# Patient Record
Sex: Male | Born: 1940 | Race: White | Hispanic: No | Marital: Married | Smoking: Current every day smoker
Health system: Southern US, Community
[De-identification: ages and names within clinical notes are randomized; demographics above are authoritative.]

## PROBLEM LIST (undated history)

## (undated) DIAGNOSIS — R569 Unspecified convulsions: Secondary | ICD-10-CM

## (undated) DIAGNOSIS — C679 Malignant neoplasm of bladder, unspecified: Secondary | ICD-10-CM

## (undated) DIAGNOSIS — I639 Cerebral infarction, unspecified: Secondary | ICD-10-CM

## (undated) DIAGNOSIS — D649 Anemia, unspecified: Secondary | ICD-10-CM

## (undated) HISTORY — PX: VEIN SURGERY: SHX48

## (undated) HISTORY — DX: Unspecified convulsions: R56.9

## (undated) HISTORY — PX: VASCULAR SURGERY: SHX849

---

## 1998-04-11 ENCOUNTER — Ambulatory Visit: Admission: RE | Admit: 1998-04-11 | Discharge: 1998-04-11 | Payer: Self-pay | Admitting: *Deleted

## 1998-05-09 ENCOUNTER — Inpatient Hospital Stay (HOSPITAL_COMMUNITY): Admission: RE | Admit: 1998-05-09 | Discharge: 1998-05-11 | Payer: Self-pay | Admitting: *Deleted

## 1999-01-01 ENCOUNTER — Ambulatory Visit: Admission: RE | Admit: 1999-01-01 | Discharge: 1999-01-01 | Payer: Self-pay | Admitting: *Deleted

## 2006-08-18 ENCOUNTER — Ambulatory Visit: Payer: Self-pay | Admitting: Vascular Surgery

## 2006-09-01 ENCOUNTER — Ambulatory Visit: Payer: Self-pay | Admitting: Vascular Surgery

## 2006-10-05 ENCOUNTER — Ambulatory Visit: Payer: Self-pay | Admitting: Vascular Surgery

## 2006-10-13 ENCOUNTER — Ambulatory Visit: Payer: Self-pay | Admitting: Vascular Surgery

## 2007-01-19 ENCOUNTER — Ambulatory Visit: Payer: Self-pay | Admitting: Vascular Surgery

## 2010-10-22 NOTE — Assessment & Plan Note (Signed)
OFFICE VISIT   Pidgeon, Jaycub L  DOB:  29-Apr-1941                                       01/19/2007  WGNFA#:[id number]   The patient had laser ablation of his right greater saphenous vein on  4/28, multiple stab phlebectomies. 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 He has had an excellent result with no discomfort  at this time.   He continues to have some scaliness and hyperpigmentation which are  chronic in the lower third of the leg but no recurrent ulcers and no  edema distally. He has excellent palpable dorsalis pedis and posterior  tibial pulses and has no large varicosities which have recurred.   He was reassured regarding these findings and he will return to see [place]  on a p.r.n. basis.   Quita Skye Hart [place], M.D.  Electronically Signed   JDL/MEDQ  D:  01/19/2007  T:  01/21/2007  Job:  [id number]

## 2020-02-10 ENCOUNTER — Emergency Department (HOSPITAL_COMMUNITY): Payer: Medicare Other

## 2020-02-10 ENCOUNTER — Other Ambulatory Visit: Payer: Self-pay

## 2020-02-10 ENCOUNTER — Encounter (HOSPITAL_COMMUNITY): Payer: Self-pay | Admitting: Internal Medicine

## 2020-02-10 ENCOUNTER — Inpatient Hospital Stay (HOSPITAL_COMMUNITY)
Admission: EM | Admit: 2020-02-10 | Discharge: 2020-02-13 | DRG: 101 | Disposition: A | Discharge status: Home / Self Care | Payer: Medicare Other | Attending: Family Medicine | Admitting: Family Medicine

## 2020-02-10 DIAGNOSIS — F17200 Nicotine dependence, unspecified, uncomplicated: Secondary | ICD-10-CM | POA: Diagnosis present

## 2020-02-10 DIAGNOSIS — R000 Tachycardia, unspecified: Secondary | ICD-10-CM | POA: Diagnosis present

## 2020-02-10 DIAGNOSIS — I959 Hypotension, unspecified: Secondary | ICD-10-CM | POA: Diagnosis present

## 2020-02-10 DIAGNOSIS — R569 Unspecified convulsions: Principal | ICD-10-CM

## 2020-02-10 DIAGNOSIS — R4182 Altered mental status, unspecified: Secondary | ICD-10-CM | POA: Diagnosis present

## 2020-02-10 DIAGNOSIS — N179 Acute kidney failure, unspecified: Secondary | ICD-10-CM

## 2020-02-10 DIAGNOSIS — R6510 Systemic inflammatory response syndrome (SIRS) of non-infectious origin without acute organ dysfunction: Secondary | ICD-10-CM

## 2020-02-10 DIAGNOSIS — I739 Peripheral vascular disease, unspecified: Secondary | ICD-10-CM | POA: Diagnosis present

## 2020-02-10 DIAGNOSIS — E785 Hyperlipidemia, unspecified: Secondary | ICD-10-CM | POA: Diagnosis present

## 2020-02-10 DIAGNOSIS — Z20822 Contact with and (suspected) exposure to covid-19: Secondary | ICD-10-CM | POA: Diagnosis present

## 2020-02-10 DIAGNOSIS — N1831 Chronic kidney disease, stage 3a: Secondary | ICD-10-CM | POA: Diagnosis present

## 2020-02-10 DIAGNOSIS — Z789 Other specified health status: Secondary | ICD-10-CM

## 2020-02-10 DIAGNOSIS — F1020 Alcohol dependence, uncomplicated: Secondary | ICD-10-CM | POA: Diagnosis present

## 2020-02-10 DIAGNOSIS — N390 Urinary tract infection, site not specified: Secondary | ICD-10-CM | POA: Diagnosis present

## 2020-02-10 DIAGNOSIS — B961 Klebsiella pneumoniae [K. pneumoniae] as the cause of diseases classified elsewhere: Secondary | ICD-10-CM | POA: Diagnosis present

## 2020-02-10 LAB — CBC
HCT: 39.8 % (ref 39.0–52.0)
Hemoglobin: 13 g/dL (ref 13.0–17.0)
MCH: 32.5 pg (ref 26.0–34.0)
MCHC: 32.7 g/dL (ref 30.0–36.0)
MCV: 99.5 fL (ref 80.0–100.0)
Platelets: 256 10*3/uL (ref 150–400)
RBC: 4 MIL/uL — ABNORMAL LOW (ref 4.22–5.81)
RDW: 12.8 % (ref 11.5–15.5)
WBC: 8.9 10*3/uL (ref 4.0–10.5)
nRBC: 0 % (ref 0.0–0.2)

## 2020-02-10 LAB — DIFFERENTIAL
Abs Immature Granulocytes: 0.03 10*3/uL (ref 0.00–0.07)
Basophils Absolute: 0.1 10*3/uL (ref 0.0–0.1)
Basophils Relative: 1 %
Eosinophils Absolute: 0.1 10*3/uL (ref 0.0–0.5)
Eosinophils Relative: 2 %
Immature Granulocytes: 0 %
Lymphocytes Relative: 30 %
Lymphs Abs: 2.7 10*3/uL (ref 0.7–4.0)
Monocytes Absolute: 0.7 10*3/uL (ref 0.1–1.0)
Monocytes Relative: 8 %
Neutro Abs: 5.2 10*3/uL (ref 1.7–7.7)
Neutrophils Relative %: 59 %

## 2020-02-10 LAB — I-STAT CHEM 8, ED
BUN: 13 mg/dL (ref 8–23)
Calcium, Ion: 1.11 mmol/L — ABNORMAL LOW (ref 1.15–1.40)
Chloride: 103 mmol/L (ref 98–111)
Creatinine, Ser: 1.4 mg/dL — ABNORMAL HIGH (ref 0.61–1.24)
Glucose, Bld: 115 mg/dL — ABNORMAL HIGH (ref 70–99)
HCT: 37 % — ABNORMAL LOW (ref 39.0–52.0)
Hemoglobin: 12.6 g/dL — ABNORMAL LOW (ref 13.0–17.0)
Potassium: 3.7 mmol/L (ref 3.5–5.1)
Sodium: 136 mmol/L (ref 135–145)
TCO2: 16 mmol/L — ABNORMAL LOW (ref 22–32)

## 2020-02-10 LAB — COMPREHENSIVE METABOLIC PANEL
ALT: 25 U/L (ref 0–44)
AST: 32 U/L (ref 15–41)
Albumin: 3.2 g/dL — ABNORMAL LOW (ref 3.5–5.0)
Alkaline Phosphatase: 84 U/L (ref 38–126)
Anion gap: 19 — ABNORMAL HIGH (ref 5–15)
BUN: 13 mg/dL (ref 8–23)
CO2: 15 mmol/L — ABNORMAL LOW (ref 22–32)
Calcium: 8.9 mg/dL (ref 8.9–10.3)
Chloride: 101 mmol/L (ref 98–111)
Creatinine, Ser: 1.65 mg/dL — ABNORMAL HIGH (ref 0.61–1.24)
GFR calc Af Amer: 45 mL/min — ABNORMAL LOW (ref >60)
GFR calc non Af Amer: 39 mL/min — ABNORMAL LOW (ref >60)
Glucose, Bld: 122 mg/dL — ABNORMAL HIGH (ref 70–99)
Potassium: 3.5 mmol/L (ref 3.5–5.1)
Sodium: 135 mmol/L (ref 135–145)
Total Bilirubin: 0.7 mg/dL (ref 0.3–1.2)
Total Protein: 7.3 g/dL (ref 6.5–8.1)

## 2020-02-10 LAB — PROTIME-INR
INR: 1.1 (ref 0.8–1.2)
Prothrombin Time: 13.4 seconds (ref 11.4–15.2)

## 2020-02-10 LAB — CBG MONITORING, ED
Glucose-Capillary: 121 mg/dL — ABNORMAL HIGH (ref 70–99)
Glucose-Capillary: 137 mg/dL — ABNORMAL HIGH (ref 70–99)

## 2020-02-10 LAB — APTT: aPTT: 31 seconds (ref 24–36)

## 2020-02-10 LAB — ETHANOL: Alcohol, Ethyl (B): <10 mg/dL (ref <10)

## 2020-02-10 MED ORDER — ONDANSETRON HCL 4 MG/2ML IJ SOLN: 4.0000 mg | Freq: Four times a day (QID) | INTRAMUSCULAR | Status: DC | PRN

## 2020-02-10 MED ORDER — ONDANSETRON HCL 4 MG PO TABS: 4.0000 mg | ORAL_TABLET | Freq: Four times a day (QID) | ORAL | Status: DC | PRN

## 2020-02-10 MED ORDER — SODIUM CHLORIDE 0.9 % IV SOLN: INTRAVENOUS | Status: AC

## 2020-02-10 MED ORDER — LEVETIRACETAM IN NACL 1000 MG/100ML IV SOLN
1000.0000 mg | Freq: Once | INTRAVENOUS | Status: AC
  Administered 2020-02-10: 1000 mg via INTRAVENOUS

## 2020-02-10 MED ORDER — SODIUM CHLORIDE 0.9 % IV BOLUS
1000.0000 mL | Freq: Once | INTRAVENOUS | Status: AC
  Administered 2020-02-10: 1000 mL via INTRAVENOUS

## 2020-02-10 MED ORDER — THIAMINE HCL 100 MG/ML IJ SOLN
100.0000 mg | Freq: Every day | INTRAMUSCULAR | Status: DC
  Administered 2020-02-11 (×2): 100 mg via INTRAVENOUS
  Filled 2020-02-10 (×2): qty 2

## 2020-02-10 MED ORDER — ENOXAPARIN SODIUM 40 MG/0.4ML ~~LOC~~ SOLN
40.0000 mg | Freq: Every day | SUBCUTANEOUS | Status: DC
  Administered 2020-02-11 – 2020-02-12 (×3): 40 mg via SUBCUTANEOUS
  Filled 2020-02-10 (×3): qty 0.4

## 2020-02-10 MED ORDER — LEVETIRACETAM IN NACL 1000 MG/100ML IV SOLN
1000.0000 mg | Freq: Once | INTRAVENOUS | Status: DC
  Filled 2020-02-10: qty 100

## 2020-02-10 NOTE — ED Provider Notes (Signed)
[facility] EMERGENCY DEPARTMENT Provider Note   CSN: [id number] Arrival date & time: 02/10/20  2036  An emergency department physician performed an initial assessment on this suspected stroke patient at 2039.  History No chief complaint on file.   Robert Blackwell is a 79 y.o. male brought in by EMS for evaluation of altered mental status.  Wife reports that about 1520 minutes prior to ED arrival, they were at home and were sitting in the recliner is watching TV.  She reports that his chair leaned all the way back.  She looked over and saw that he had started shaking both his arms and his legs and foaming at the mouth.  She states that he went unresponsive but was still breathing.  She states that this shaking lasted for about 2 minutes.  She reports that after the shaking stopped, he was still altered and was not responding though was breathing the entire time.  He called EMS.  On EMS arrival, patient was becoming more alert but was combative. Patient states he does not member happened.  He states that last he remembers was watching TV and then waking up in the ambulance.  At this time, he has no complaints.  He states he has been in his normal state of health recently and no signs of sickness, fevers.  He denies any vision changes, chest pain, difficulty breathing, abdominal pain, nausea/vomiting, numbness/weakness of his arms or legs.  He states he smokes.  He also drinks alcohol.  He estimates that he may drink 2-3 beers a day.  His last drink was 3 PM.  He has never gone into withdrawals from alcohol.  The history is provided by the patient.       No past medical history on file.  Patient Active Problem List   Diagnosis Date Noted  . Seizure ([place]) 02/10/2020  . ARF (acute renal failure) ([place]) 02/10/2020     The histories are not reviewed yet. Please review them in the "History" navigator section and refresh this [place].     No family history on file.  Social  History   Tobacco Use  . Smoking status: Not on file  Substance Use Topics  . Alcohol use: Not on file  . Drug use: Not on file    Home Medications Prior to Admission medications   Not on File    Allergies    Patient has no allergy information on record.  Review of Systems   Review of Systems  Constitutional: Negative for fever.  Respiratory: Negative for cough and shortness of breath.   Cardiovascular: Negative for chest pain.  Gastrointestinal: Negative for abdominal pain, nausea and vomiting.  Genitourinary: Negative for dysuria and hematuria.  Neurological: Positive for seizures. Negative for weakness, numbness and headaches.  All other systems reviewed and are negative.   Physical Exam Updated Vital Signs BP 100/65   Pulse 97   Temp 98.1 F (36.7 C) (Oral)   Resp 17   Ht 5\' 10"  (1.778 m)   Wt 77.1 kg   SpO2 95%   BMI 24.39 kg/m   Physical Exam Vitals and nursing note reviewed.  Constitutional:      Appearance: Normal appearance. He is well-developed.  HENT:     Head: Normocephalic and atraumatic.  Eyes:     General: Lids are normal.     Conjunctiva/sclera: Conjunctivae normal.     Pupils: Pupils are equal, round, and reactive to light.     Comments: PERRL.  EOMs intact. No nystagmus. No neglect.   Cardiovascular:     Rate and Rhythm: Normal rate and regular rhythm.     Pulses: Normal pulses.     Heart sounds: Normal heart sounds. No murmur heard.  No friction rub. No gallop.   Pulmonary:     Effort: Pulmonary effort is normal.     Breath sounds: Normal breath sounds.     Comments: Lungs clear to auscultation bilaterally.  Symmetric chest rise.  No wheezing, rales, rhonchi. Abdominal:     Palpations: Abdomen is soft. Abdomen is not rigid.     Tenderness: There is no abdominal tenderness. There is no guarding.     Comments: Abdomen is soft, non-distended, non-tender. No rigidity, No guarding. No peritoneal signs.  Musculoskeletal:        General:  Normal range of motion.     Cervical back: Full passive range of motion without pain.  Skin:    General: Skin is warm and dry.     Capillary Refill: Capillary refill takes less than 2 seconds.  Neurological:     Mental Status: He is alert and oriented to person, place, and time.     Comments: Cranial nerves III-XII intact Follows commands, Moves all extremities  5/5 strength to BUE and BLE  Sensation intact throughout all major nerve distributions No pronator drift. No slurred speech. No facial droop.   Psychiatric:        Speech: Speech normal.     ED Results / Procedures / Treatments   Labs (all labs ordered are listed, but only abnormal results are displayed) Labs Reviewed  CBC - Abnormal; Notable for the following components:      Result Value   RBC 4.00 (*)    All other components within normal limits  COMPREHENSIVE METABOLIC PANEL - Abnormal; Notable for the following components:   CO2 15 (*)    Glucose, Bld 122 (*)    Creatinine, Ser 1.65 (*)    Albumin 3.2 (*)    GFR calc non Af Amer 39 (*)    GFR calc Af Amer 45 (*)    Anion gap 19 (*)    All other components within normal limits  I-STAT CHEM 8, ED - Abnormal; Notable for the following components:   Creatinine, Ser 1.40 (*)    Glucose, Bld 115 (*)    Calcium, Ion 1.11 (*)    TCO2 16 (*)    Hemoglobin 12.6 (*)    HCT 37.0 (*)    All other components within normal limits  CBG MONITORING, ED - Abnormal; Notable for the following components:   Glucose-Capillary 121 (*)    All other components within normal limits  CBG MONITORING, ED - Abnormal; Notable for the following components:   Glucose-Capillary 137 (*)    All other components within normal limits  SARS CORONAVIRUS 2 BY RT PCR (HOSPITAL ORDER, [facility])  ETHANOL  PROTIME-INR  APTT  DIFFERENTIAL  RAPID URINE DRUG SCREEN, HOSP PERFORMED  URINALYSIS, ROUTINE W REFLEX MICROSCOPIC    EKG EKG  Interpretation  Date/Time:  Friday February 10 2020 21:00:05 EDT Ventricular Rate:  105 PR Interval:    QRS Duration: 96 QT Interval:  356 QTC Calculation: 471 R Axis:   35 Text Interpretation: Sinus tachycardia Borderline T abnormalities, lateral leads No STEMI Confirmed by Octaviano Glow [phone] on 02/10/2020 10:14:20 PM   Radiology CT HEAD CODE STROKE WO CONTRAST  Result Date: 02/10/2020 CLINICAL DATA:  Code  stroke. Neuro deficit, acute, stroke suspected. EXAM: CT HEAD WITHOUT CONTRAST TECHNIQUE: Contiguous axial images were obtained from the base of the skull through the vertex without intravenous contrast. COMPARISON:  None. FINDINGS: Brain: No acute cortically based infarct, intracranial hemorrhage, mass, midline shift, or extra-axial fluid collection is identified. Mild cerebral atrophy is within normal limits for age. Patchy hypodensities in the cerebral white matter bilaterally are nonspecific but compatible with mild-to-moderate chronic small vessel ischemic disease. There are one or two suspected chronic lacunar infarcts along the left caudate body and periventricular white matter. A subcentimeter left cerebellar infarct also is chronic in appearance. Vascular: Calcified atherosclerosis at the skull base. No hyperdense vessel. Skull: No fracture or suspicious osseous lesion. Sinuses/Orbits: Minimal mucosal thickening in the ethmoid sinuses. Clear mastoid air cells. Unremarkable orbits. Other: None. ASPECTS [facility] Stroke Program Early CT Score) - Ganglionic level infarction (caudate, lentiform nuclei, internal capsule, insula, M1-M3 cortex): 7 - Supraganglionic infarction (M4-M6 cortex): 3 Total score (0-10 with 10 being normal): 10 IMPRESSION: 1. No evidence of acute intracranial abnormality. 2. ASPECTS is 10. 3. Mild-to-moderate chronic small vessel ischemic disease. These results were communicated to Dr. Leonel Ramsay at 8:59 pm on 02/10/2020 by text page via the [facility] messaging system.  Electronically Signed   By: Logan Bores M.D.   On: 02/10/2020 21:00    Procedures Procedures (including critical care time)  Medications Ordered in ED Medications  sodium chloride 0.9 % bolus 1,000 mL (1,000 mLs Intravenous Bolus from Bag 02/10/20 2226)  levETIRAcetam (KEPPRA) IVPB 1000 mg/100 mL premix (1,000 mg Intravenous New Bag/Given 02/10/20 2316)    ED Course  I have reviewed the triage vital signs and the nursing notes.  Pertinent labs & imaging results that were available during my care of the patient were reviewed by me and considered in my medical decision making (see chart for details).  Clinical Course as of Feb 09 2350  Fri Feb 10, 2020  2242 Pt encouraged to provide urine sample, says she will do so.   [MT]  2242 79 yo male presenting to ED with suspected new onset seizure.  Wife provides supplemental history.  She says patient was in recliner this evening watching TV when his entire body stiffned, his legs sticking straight out, his hands clamped over his chest, and he had generalized shaking and heavy breathing for a few minutes.  She called EMS.  He remained confused with heavy breathing by the time they arrived.  He has since returned to baseline mental state.  The patient has no acute complaints, denies headaches or neuro symptoms.  He has no hx of seizures.  No infectious symptoms or signs.  He reports drinking beer daily, last drink at 3 pm, but denies hx of withdrawal or seizures, and goes days without drinking.  On exam his vitals are WNL, afebrile, and he has a benign neuro exam.  Na and K normal.  Cr elevated at 1.65 - patient made aware (he does not see a doctor regularly, I recommended establishing care and regular appointments).  Glucose wnl.  CTH without acute abnormality.  Pending UA to complete infectious w/u.  Will discuss with neurology regarding treatment for new onset seizure.   [MT]    Clinical Course User Index [MT] Trifan, Carola Rhine, MD   MDM  Rules/Calculators/A&P                          79 year old male who presents for evaluation of  possible seizure activity.  Wife noted that at home, he had leaned back and started shaking both his arms and legs.  This lasted for about 2 minutes.  During which time, he was unresponsive.  No vomiting, urinary incontinence.  She reports that after the shaking stopped, he was still altered.  On EMS arrival, he was becoming more alert but was combative.  Wife states that he has not been sick recently.  Denies any fevers, chills.  On initially arrival, he is afebrile, nontoxic-appearing.  Vital signs are stable.  He is alert and oriented x 3 and able to answer my questions.  Wife does report that he is now back to his mental baseline.  He was initially brought in as a code stroke but was canceled given resolution of symptoms.  On exam, he has no neuro deficits noted.  Concern for possible first-time seizure.  Neuro consulted.  CMP shows sodium 135, potassium 3.5.  BUN is 13, cardio 1.65.  CBC shows no leukocytosis or anemia.  CT head negative for any acute cranial abnormality.  Patient had a another seizure witnessed by wife and ED attending.  Afterwards, he was postictal.  Patient started on loading dose of Keppra.  Will be admitted for EEG. Dr. Leonel Ramsay (Neuro) aware. Will consult as needed.   Discussed patient with Dr. Hal Hope (hospitalist) who accepts patient for admission.   Reevaluation. Patient still postictal but is alert and protecting his airway. Keppra given.   Portions of this note were generated with [employer]. Dictation errors may occur despite best attempts at proofreading.  Final Clinical Impression(s) / ED Diagnoses Final diagnoses:  AKI (acute kidney injury) ([place])  Seizure ([place])    Rx / DC Orders ED Discharge Orders         Ordered    Ambulatory referral to Neurology       Comments: An appointment is requested in approximately: 1 week   02/10/20 2214            Volanda Napoleon, PA-C 02/10/20 2352    Wyvonnia Dusky, MD 02/11/20 [phone]

## 2020-02-10 NOTE — Code Documentation (Signed)
Stroke Response Nurse Documentation Code Documentation  Robert Blackwell is a 79 y.o. male arriving to [place]. [facility] ED via [facility] on 9/3 with no past medical hx on file. Pt is a current smoker and drinks a couple of beers daily. Code stroke was activated by [facility]. Patient from home where he was LKW at 2010 and now complaining of AMS. His wife witnessed the patient become stiff and foam at the mouth while watching TV. On No antithrombotic. Stroke team at the bedside on patient arrival. Labs drawn and patient cleared for CT by Dr. Sedonia Small. Patient to CT with team. NIHSS 2, see documentation for details and code stroke times. Patient with disoriented on exam. The following imaging was completed:  CT. Code stroke was cancelled per Dr. Leonel Ramsay. Bedside handoff with ED RN Beryle Flock.    Madelynn Done  Rapid Response RN

## 2020-02-10 NOTE — Consult Note (Signed)
Neurology Consultation Reason for Consult: New onset seizures Referring Physician: Hal Hope, a  CC: Seizures  History is obtained from: Wife, EMS  HPI: Robert Blackwell is a 79 y.o. male with no significant past medical history who presents with new onset seizures.  He was watching TV with his wife when all of a sudden he kicked his legs out and his arms flexed and and he shook for a couple of minutes.  Following this, he was unresponsive and his wife dialed 911.  Due to altered mental status, EMS activated a code stroke, and the patient became combative in the ambulance.  Gradually improved to the point where he was talkative on arrival.  After being evaluated with head CT and labs, he subsequently had a second seizure.  I was able to evaluate him shortly after the seizure at which point he was postictal, but localizing bilaterally.  He drinks a couple beers a day, but no recent changes, had a beer earlier today.   ROS: A 14 point ROS was performed and is negative except as noted in the HPI.   History reviewed. No pertinent past medical history.   Family History  Problem Relation Age of Onset  . Seizures Neg Hx      Social History:  reports that he has been smoking. He has never used smokeless tobacco. He reports current alcohol use. No history on file for drug use.   Exam: Current vital signs: BP 100/65   Pulse 97   Temp 98.1 F (36.7 C) (Oral)   Resp 17   Ht 5\' 10"  (1.778 m)   Wt 77.1 kg   SpO2 95%   BMI 24.39 kg/m  Vital signs in last 24 hours: Temp:  [98.1 F (36.7 C)] 98.1 F (36.7 C) (09/03 2101) Pulse Rate:  [97-102] 97 (09/03 2200) Resp:  [17-20] 17 (09/03 2200) BP: (100-111)/(65-75) 100/65 (09/03 2200) SpO2:  [92 %-97 %] 95 % (09/03 2204) Weight:  [75 kg-77.1 kg] 77.1 kg (09/03 2101)   Physical Exam  Constitutional: Appears well-developed and well-nourished.  Psych: Affect appropriate to situation Eyes: No scleral injection HENT: No OP obstrucion MSK:  no joint deformities.  Cardiovascular: Normal rate and regular rhythm.  Respiratory: Effort normal, non-labored breathing GI: Soft.  No distension. There is no tenderness.  Skin: WDI  Neuro: Mental Status: Patient is awake, alert, oriented to person, place, not able to give the month or year patient is able to give a clear and coherent history. No signs of aphasia or neglect Cranial Nerves: II: Visual Fields are full. Pupils are equal, round, and reactive to light.   III,IV, VI: EOMI without ptosis or diploplia.  V: Facial sensation is symmetric to temperature VII: Facial movement is symmetric.  VIII: hearing is intact to voice X: Uvula elevates symmetrically XI: Shoulder shrug is symmetric. XII: tongue is midline without atrophy or fasciculations.  Motor: Tone is normal. Bulk is normal. 5/5 strength was present in all four extremities.  Sensory: Sensation is symmetric to light touch and temperature in the arms and legs. Cerebellar: FNF and HKS are intact bilaterally    I have reviewed labs in epic and the results pertinent to this consultation are: Creatinine 1.6 WBC 8.9  I have reviewed the images obtained: CT head-negative  Impression: 79 year old male with new onset seizures, now with two separate seizures.  I would favor antiepileptic therapy at this time.  He will need further evaluation with MRI/EEG.  No current signs or symptoms of infection.  Recommendations: 1) MRI brain 2) EEG 3) Keppra 500 mg twice daily   Roland Rack, MD [facility] [phone]  If 7pm- 7am, please page neurology on call as listed in [place].

## 2020-02-10 NOTE — Discharge Instructions (Addendum)
As we discussed today, we are concerned you may have had a seizure.  You should not drive a vehicle, bathe, shower or swimming pool by herself until cleared by neurology.  I referred you to [facility] neurology.  They should be contacting you regarding an appointment.  If you have not heard from them in the next several days, call their office.  Your kidney function was slightly elevated in the ER today. This needs to be rechecked by your primary care doctor in 1 week.   Return the emergency department any repeat seizure activity, vomiting or any other worsening or concerning symptoms.

## 2020-02-10 NOTE — H&P (Addendum)
History and Physical    Robert Blackwell TIR:[id number] DOB: Oct 07, 1940 DOA: 02/10/2020  PCP: No primary care provider on file.  Patient coming from: Home.  Chief Complaint: Seizures.  HPI: Robert Blackwell is a 79 y.o. male with history of peripheral vascular disease status post left lower extremity bypass, hyperlipidemia, tobacco and alcohol abuse drinks 4 beers every day was watching TV with his wife this evening when patient suddenly had a tonic-clonic seizure witnessed by patient's wife. Lasted for about 1 or 2 minutes and patient lost consciousness. EMS was called and patient was brought to the ER. Patient did not have any headache or any focal deficits. Has not had any seizures previously.  ED Course: In the ER patient was back to normal CT head is unremarkable. EKG shows sinus tachycardia. Plan is to discharge patient home and patient again had a tonic-clonic seizure lasted for less than a minute and resolved without any intervention. At this point neurologist advised patient to be loaded with Keppra and admitted for further seizure work-up. At the time of my exam patient is back to alert awake and oriented labs showing creatinine 1.6 no old labs to compare.  Review of Systems: As per HPI, rest all negative.   History reviewed. No pertinent past medical history.  Past Surgical History:  Procedure Laterality Date  . VASCULAR SURGERY    . VEIN SURGERY       reports that he has been smoking. He has never used smokeless tobacco. He reports current alcohol use. No history on file for drug use.  Not on File  Family History  Problem Relation Age of Onset  . Seizures Neg Hx     Prior to Admission medications   Not on File    Physical Exam: Constitutional: Moderately built and nourished. Vitals:   02/10/20 2101 02/10/20 2145 02/10/20 2200 02/10/20 2204  BP: 111/75 103/67 100/65   Pulse: (!) 102 100 97   Resp: 20 19 17    Temp: 98.1 F (36.7 C)     TempSrc: Oral     SpO2: 97% 94%  92% 95%  Weight: 77.1 kg     Height: 5\' 10"  (1.778 m)      Eyes: Anicteric no pallor. ENMT: No discharge from the ears eyes nose or mouth. Neck: No mass felt. No neck rigidity. Respiratory: No rhonchi or crepitations. Cardiovascular: S1-S2 heard. Abdomen: Soft nontender bowel sounds present. Musculoskeletal: No edema. Skin: No rash. Neurologic: Alert awake oriented to time place and person. Moves all extremities 5 x 5. Psychiatric: Appears normal. Normal affect.   Labs on Admission: I have personally reviewed following labs and imaging studies  CBC: Recent Labs  Lab 02/10/20 2043 02/10/20 2044  WBC 8.9  --   NEUTROABS 5.2  --   HGB 13.0 12.6*  HCT 39.8 37.0*  MCV 99.5  --   PLT 256  --    Basic Metabolic Panel: Recent Labs  Lab 02/10/20 2043 02/10/20 2044  NA 135 136  K 3.5 3.7  CL 101 103  CO2 15*  --   GLUCOSE 122* 115*  BUN 13 13  CREATININE 1.65* 1.40*  CALCIUM 8.9  --    GFR: Estimated Creatinine Clearance: 44.2 mL/min (A) (by C-G formula based on SCr of 1.4 mg/dL (H)). Liver Function Tests: Recent Labs  Lab 02/10/20 2043  AST 32  ALT 25  ALKPHOS 84  BILITOT 0.7  PROT 7.3  ALBUMIN 3.2*   No results for input(s): LIPASE,  AMYLASE in the last 168 hours. No results for input(s): AMMONIA in the last 168 hours. Coagulation Profile: Recent Labs  Lab 02/10/20 2043  INR 1.1   Cardiac Enzymes: No results for input(s): CKTOTAL, CKMB, CKMBINDEX, TROPONINI in the last 168 hours. BNP (last 3 results) No results for input(s): PROBNP in the last 8760 hours. HbA1C: No results for input(s): HGBA1C in the last 72 hours. CBG: Recent Labs  Lab 02/10/20 2038 02/10/20 2258  GLUCAP 121* 137*   Lipid Profile: No results for input(s): CHOL, HDL, LDLCALC, TRIG, CHOLHDL, LDLDIRECT in the last 72 hours. Thyroid Function Tests: No results for input(s): TSH, T4TOTAL, FREET4, T3FREE, THYROIDAB in the last 72 hours. Anemia Panel: No results for input(s):  VITAMINB12, FOLATE, FERRITIN, TIBC, IRON, RETICCTPCT in the last 72 hours. Urine analysis: No results found for: COLORURINE, APPEARANCEUR, LABSPEC, PHURINE, GLUCOSEU, HGBUR, BILIRUBINUR, KETONESUR, PROTEINUR, UROBILINOGEN, NITRITE, LEUKOCYTESUR Sepsis Labs: @LABRCNTIP (procalcitonin:4,lacticidven:4) )No results found for this or any previous visit (from the past 240 hour(s)).   Radiological Exams on Admission: CT HEAD CODE STROKE WO CONTRAST  Result Date: 02/10/2020 CLINICAL DATA:  Code stroke. Neuro deficit, acute, stroke suspected. EXAM: CT HEAD WITHOUT CONTRAST TECHNIQUE: Contiguous axial images were obtained from the base of the skull through the vertex without intravenous contrast. COMPARISON:  None. FINDINGS: Brain: No acute cortically based infarct, intracranial hemorrhage, mass, midline shift, or extra-axial fluid collection is identified. Mild cerebral atrophy is within normal limits for age. Patchy hypodensities in the cerebral white matter bilaterally are nonspecific but compatible with mild-to-moderate chronic small vessel ischemic disease. There are one or two suspected chronic lacunar infarcts along the left caudate body and periventricular white matter. A subcentimeter left cerebellar infarct also is chronic in appearance. Vascular: Calcified atherosclerosis at the skull base. No hyperdense vessel. Skull: No fracture or suspicious osseous lesion. Sinuses/Orbits: Minimal mucosal thickening in the ethmoid sinuses. Clear mastoid air cells. Unremarkable orbits. Other: None. ASPECTS [facility] Stroke Program Early CT Score) - Ganglionic level infarction (caudate, lentiform nuclei, internal capsule, insula, M1-M3 cortex): 7 - Supraganglionic infarction (M4-M6 cortex): 3 Total score (0-10 with 10 being normal): 10 IMPRESSION: 1. No evidence of acute intracranial abnormality. 2. ASPECTS is 10. 3. Mild-to-moderate chronic small vessel ischemic disease. These results were communicated to Dr. Leonel Ramsay  at 8:59 pm on 02/10/2020 by text page via the [facility] messaging system. Electronically Signed   By: Logan Bores M.D.   On: 02/10/2020 21:00    EKG: Independently reviewed. Sinus tachycardia.  Assessment/Plan Principal Problem:   Seizure ([place]) Active Problems:   AKI (acute kidney injury) ([place])    1. Seizures new onset -discussed with on-call neurologist Dr. Leonel Ramsay who advised to continue Keppra 500 twice daily. Check MRI brain and EEG. Seizure precautions. 2. Alcohol abuse advised about quitting. We will keep patient on thiamine we will start CIWA protocol. 3. Acute renal failure no old labs to compare. We will gently hydrate. Check UA. Follow metabolic panel. 4. History of peripheral artery disease status post left-sided bypass. No acute issues at this time takes aspirin at home. 5. History of hyperlipidemia per chart in Care Everywhere. Not on any statins at this time. 6. Tobacco abuse advised about quitting.  Addendum -at around 4:20 AM February 11, 2020 patient became hypotensive with blood pressure systolic in the 66Y. Otherwise asymptomatic. Afebrile. Not hypoxic. Will give a fluid bolus check repeat labs including lactic acid procalcitonin blood cultures.   DVT prophylaxis: Lovenox. Code Status: Full code. Family Communication: Patient's wife. Disposition  Plan: Home. Consults called: Neurology. Admission status: Observation.   Rise Patience MD [facility] Pager [phone].  If 7PM-7AM, please contact night-coverage [email or web address] Password [facility]  02/10/2020, 11:57 PM

## 2020-02-10 NOTE — ED Notes (Signed)
Pt's wife stepped out of the room yelling "Help, help!" and stated her husband was having a seizure. Myself and an RN went into the room to ensure the pt was breathing adequately. The pt was then placed on 2lpm via NRB.

## 2020-02-11 ENCOUNTER — Observation Stay (HOSPITAL_COMMUNITY): Payer: Medicare Other

## 2020-02-11 ENCOUNTER — Other Ambulatory Visit (HOSPITAL_COMMUNITY): Payer: Medicare Other

## 2020-02-11 DIAGNOSIS — N179 Acute kidney failure, unspecified: Secondary | ICD-10-CM | POA: Diagnosis not present

## 2020-02-11 DIAGNOSIS — R569 Unspecified convulsions: Secondary | ICD-10-CM | POA: Diagnosis not present

## 2020-02-11 LAB — RAPID URINE DRUG SCREEN, HOSP PERFORMED
Amphetamines: NOT DETECTED
Barbiturates: NOT DETECTED
Benzodiazepines: NOT DETECTED
Cocaine: NOT DETECTED
Opiates: NOT DETECTED
Tetrahydrocannabinol: NOT DETECTED

## 2020-02-11 LAB — URINALYSIS, ROUTINE W REFLEX MICROSCOPIC
Bilirubin Urine: NEGATIVE
Glucose, UA: NEGATIVE mg/dL
Ketones, ur: NEGATIVE mg/dL
Nitrite: POSITIVE — AB
Protein, ur: NEGATIVE mg/dL
Specific Gravity, Urine: 1.014 (ref 1.005–1.030)
WBC, UA: >50 WBC/hpf — ABNORMAL HIGH (ref 0–5)
pH: 5 (ref 5.0–8.0)

## 2020-02-11 LAB — BLOOD CULTURE ID PANEL (REFLEXED) - BCID2

## 2020-02-11 LAB — CBC
HCT: 34.6 % — ABNORMAL LOW (ref 39.0–52.0)
HCT: 33.7 % — ABNORMAL LOW (ref 39.0–52.0)
Hemoglobin: 11.4 g/dL — ABNORMAL LOW (ref 13.0–17.0)
Hemoglobin: 10.9 g/dL — ABNORMAL LOW (ref 13.0–17.0)
MCH: 32.2 pg (ref 26.0–34.0)
MCH: 31.6 pg (ref 26.0–34.0)
MCHC: 32.9 g/dL (ref 30.0–36.0)
MCHC: 32.3 g/dL (ref 30.0–36.0)
MCV: 97.7 fL (ref 80.0–100.0)
MCV: 97.7 fL (ref 80.0–100.0)
Platelets: 168 10*3/uL (ref 150–400)
Platelets: 192 10*3/uL (ref 150–400)
RBC: 3.54 MIL/uL — ABNORMAL LOW (ref 4.22–5.81)
RBC: 3.45 MIL/uL — ABNORMAL LOW (ref 4.22–5.81)
RDW: 12.8 % (ref 11.5–15.5)
RDW: 12.9 % (ref 11.5–15.5)
WBC: 7.4 10*3/uL (ref 4.0–10.5)
WBC: 6.9 10*3/uL (ref 4.0–10.5)
nRBC: 0 % (ref 0.0–0.2)
nRBC: 0 % (ref 0.0–0.2)

## 2020-02-11 LAB — CORTISOL: Cortisol, Plasma: 8 ug/dL

## 2020-02-11 LAB — HEPATIC FUNCTION PANEL
ALT: 23 U/L (ref 0–44)
AST: 24 U/L (ref 15–41)
Albumin: 2.7 g/dL — ABNORMAL LOW (ref 3.5–5.0)
Alkaline Phosphatase: 68 U/L (ref 38–126)
Bilirubin, Direct: <0.1 mg/dL (ref 0.0–0.2)
Total Bilirubin: 0.7 mg/dL (ref 0.3–1.2)
Total Protein: 5.9 g/dL — ABNORMAL LOW (ref 6.5–8.1)

## 2020-02-11 LAB — BASIC METABOLIC PANEL
Anion gap: 10 (ref 5–15)
BUN: 11 mg/dL (ref 8–23)
CO2: 20 mmol/L — ABNORMAL LOW (ref 22–32)
Calcium: 8.5 mg/dL — ABNORMAL LOW (ref 8.9–10.3)
Chloride: 107 mmol/L (ref 98–111)
Creatinine, Ser: 1.44 mg/dL — ABNORMAL HIGH (ref 0.61–1.24)
GFR calc Af Amer: 53 mL/min — ABNORMAL LOW (ref >60)
GFR calc non Af Amer: 46 mL/min — ABNORMAL LOW (ref >60)
Glucose, Bld: 132 mg/dL — ABNORMAL HIGH (ref 70–99)
Potassium: 3.9 mmol/L (ref 3.5–5.1)
Sodium: 137 mmol/L (ref 135–145)

## 2020-02-11 LAB — LACTIC ACID, PLASMA
Lactic Acid, Venous: 1.2 mmol/L (ref 0.5–1.9)
Lactic Acid, Venous: 1.3 mmol/L (ref 0.5–1.9)

## 2020-02-11 LAB — TROPONIN I (HIGH SENSITIVITY): Troponin I (High Sensitivity): 31 ng/L — ABNORMAL HIGH (ref <18)

## 2020-02-11 LAB — PROCALCITONIN: Procalcitonin: <0.1 ng/mL

## 2020-02-11 LAB — CREATININE, SERUM
Creatinine, Ser: 1.4 mg/dL — ABNORMAL HIGH (ref 0.61–1.24)
GFR calc Af Amer: 55 mL/min — ABNORMAL LOW (ref >60)
GFR calc non Af Amer: 47 mL/min — ABNORMAL LOW (ref >60)

## 2020-02-11 LAB — SARS CORONAVIRUS 2 BY RT PCR (HOSPITAL ORDER, PERFORMED IN ~~LOC~~ HOSPITAL LAB): SARS Coronavirus 2: NEGATIVE

## 2020-02-11 MED ORDER — SODIUM CHLORIDE 0.9 % IV SOLN
1.0000 g | INTRAVENOUS | Status: DC
  Administered 2020-02-11 – 2020-02-13 (×3): 1 g via INTRAVENOUS
  Filled 2020-02-11 (×2): qty 1
  Filled 2020-02-11: qty 10

## 2020-02-11 MED ORDER — ADULT MULTIVITAMIN W/MINERALS CH
1.0000 | ORAL_TABLET | Freq: Every day | ORAL | Status: DC
  Administered 2020-02-11 – 2020-02-13 (×3): 1 via ORAL
  Filled 2020-02-11 (×3): qty 1

## 2020-02-11 MED ORDER — THIAMINE HCL 100 MG/ML IJ SOLN: 100.0000 mg | Freq: Every day | INTRAMUSCULAR | Status: DC

## 2020-02-11 MED ORDER — SODIUM CHLORIDE 0.9 % IV BOLUS
1000.0000 mL | Freq: Once | INTRAVENOUS | Status: AC
  Administered 2020-02-11: 1000 mL via INTRAVENOUS

## 2020-02-11 MED ORDER — SODIUM CHLORIDE 0.9 % IV BOLUS
500.0000 mL | Freq: Once | INTRAVENOUS | Status: AC
  Administered 2020-02-11: 500 mL via INTRAVENOUS

## 2020-02-11 MED ORDER — THIAMINE HCL 100 MG PO TABS
100.0000 mg | ORAL_TABLET | Freq: Every day | ORAL | Status: DC
  Administered 2020-02-11 – 2020-02-13 (×3): 100 mg via ORAL
  Filled 2020-02-11 (×3): qty 1

## 2020-02-11 MED ORDER — FOLIC ACID 1 MG PO TABS
1.0000 mg | ORAL_TABLET | Freq: Every day | ORAL | Status: DC
  Administered 2020-02-11 – 2020-02-13 (×3): 1 mg via ORAL
  Filled 2020-02-11 (×3): qty 1

## 2020-02-11 MED ORDER — LEVETIRACETAM 500 MG PO TABS
500.0000 mg | ORAL_TABLET | Freq: Two times a day (BID) | ORAL | Status: DC
  Administered 2020-02-11 – 2020-02-13 (×5): 500 mg via ORAL
  Filled 2020-02-11 (×5): qty 1

## 2020-02-11 MED ORDER — LORAZEPAM 2 MG/ML IJ SOLN: 1.0000 mg | INTRAMUSCULAR | Status: DC | PRN

## 2020-02-11 MED ORDER — LORAZEPAM 1 MG PO TABS: 1.0000 mg | ORAL_TABLET | ORAL | Status: DC | PRN

## 2020-02-11 NOTE — Progress Notes (Signed)
PHARMACY - PHYSICIAN COMMUNICATION CRITICAL VALUE ALERT - BLOOD CULTURE IDENTIFICATION (BCID)  Robert Blackwell is an 79 y.o. male who presented to [facility] on 02/10/2020 with a chief complaint of seizures  Name of physician (or Provider) Contacted:  Zierle-Ghosh  Current antibiotics: ceftriaxone  Changes to prescribed antibiotics recommended:  Likely contaminant, no changes needed at this time  Results for orders placed or performed during the hospital encounter of 02/10/20  Blood Culture ID Panel (Reflexed) (Collected: 02/11/2020  5:05 AM)  Result Value Ref Range   Enterococcus faecalis NOT DETECTED NOT DETECTED   Enterococcus Faecium NOT DETECTED NOT DETECTED   Listeria monocytogenes NOT DETECTED NOT DETECTED   Staphylococcus species DETECTED (A) NOT DETECTED   Staphylococcus aureus (BCID) NOT DETECTED NOT DETECTED   Staphylococcus epidermidis DETECTED (A) NOT DETECTED   Staphylococcus lugdunensis NOT DETECTED NOT DETECTED   Streptococcus species NOT DETECTED NOT DETECTED   Streptococcus agalactiae NOT DETECTED NOT DETECTED   Streptococcus pneumoniae NOT DETECTED NOT DETECTED   Streptococcus pyogenes NOT DETECTED NOT DETECTED   A.calcoaceticus-baumannii NOT DETECTED NOT DETECTED   Bacteroides fragilis NOT DETECTED NOT DETECTED   Enterobacterales NOT DETECTED NOT DETECTED   Enterobacter cloacae complex NOT DETECTED NOT DETECTED   Escherichia coli NOT DETECTED NOT DETECTED   Klebsiella aerogenes NOT DETECTED NOT DETECTED   Klebsiella oxytoca NOT DETECTED NOT DETECTED   Klebsiella pneumoniae NOT DETECTED NOT DETECTED   Proteus species NOT DETECTED NOT DETECTED   Salmonella species NOT DETECTED NOT DETECTED   Serratia marcescens NOT DETECTED NOT DETECTED   Haemophilus influenzae NOT DETECTED NOT DETECTED   Neisseria meningitidis NOT DETECTED NOT DETECTED   Pseudomonas aeruginosa NOT DETECTED NOT DETECTED   Stenotrophomonas maltophilia NOT DETECTED NOT DETECTED   Candida albicans NOT  DETECTED NOT DETECTED   Candida auris NOT DETECTED NOT DETECTED   Candida glabrata NOT DETECTED NOT DETECTED   Candida krusei NOT DETECTED NOT DETECTED   Candida parapsilosis NOT DETECTED NOT DETECTED   Candida tropicalis NOT DETECTED NOT DETECTED   Cryptococcus neoformans/gattii NOT DETECTED NOT DETECTED   Methicillin resistance mecA/C DETECTED (A) NOT DETECTED    Ahyan Kreeger, Rande Lawman 02/11/2020  9:04 PM

## 2020-02-11 NOTE — ED Notes (Signed)
Pt. Transported to MRI 

## 2020-02-11 NOTE — Progress Notes (Signed)
EEG complete - results pending 

## 2020-02-11 NOTE — ED Notes (Signed)
Pt resting  His wife at  The bedside reports that he has been sleeping more today than usual and that he does not have much of an appetite

## 2020-02-11 NOTE — Progress Notes (Signed)
Pharmacy rec's no changes to antibiotics with blood culture results.

## 2020-02-11 NOTE — ED Notes (Signed)
Tele  Breakfast Ordered 

## 2020-02-11 NOTE — Progress Notes (Signed)
PROGRESS NOTE    Robert Blackwell  CVE:[id number] DOB: [birth year] DOA: 02/10/2020 PCP: Jefm Petty, MD  Outpatient Specialists:   Brief Narrative:  Patient is a 79 year old male with past medical history significant for peripheral vascular disease status post left lower extremity bypass, hyperlipidemia, tobacco and alcohol abuse (patient drinks 4 beers every day).  Patient was admitted with seizure.  Another tonic-clonic seizure was noted in the ER.  Neurology was consulted and patient has been on Keppra.  UA suggestive of UTI.  Will send urine for culture, and will start patient on IV Rocephin.  Patient was also noted to be hypotensive, will check cortisol, get an echocardiogram and cycle cardiac enzymes.  Will hydrate patient.  Assessment & Plan:   Principal Problem:   Seizure ([place]) Active Problems:   AKI (acute kidney injury) ([place])  UTI/hypotension/SIRS: -Follow urine culture. -Hydrate patient. -IV Rocephin. -Check cortisol. -Echocardiogram. -Trend cardiac enzymes. -Further management depend on hospital course.  Seizures new onset: -Neurology is directing care. -Currently on Keppra. -Patient is an alcoholic. -MRI brain noted. -Follow EEG.  Alcohol abuse, continuous: -On thiamine and CIWA protocol -Counseled to quit alcohol.  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  History of peripheral artery disease status post left-sided bypass: -No acute issues at this time takes aspirin at home. -Stable.  Tobacco use disorder: -Counseled.  Hyperlipidemia.  DVT prophylaxis: Subacute Lovenox Code Status: Full code Family Communication: Patient's wife Disposition Plan: This will depend on hospital course   Consultants:   Neurology  Procedures:   None  Antimicrobials:   IV Rocephin   Subjective: No new  complaints Blood pressure remains on the low side  Objective: Vitals:   02/11/20 0816 02/11/20 0830 02/11/20 0833 02/11/20 0900  BP: (!) 92/53 (!) 84/45 (!) 90/51 104/60  Pulse: (!) 52 (!) 49 (!) 53 (!) 56  Resp: 15 16 14 14   Temp:      TempSrc:      SpO2: 100% 100% 100% 99%  Weight:      Height:       No intake or output data in the 24 hours ending 02/11/20 1126 Filed Weights   02/10/20 2000 02/10/20 2101  Weight: 75 kg 77.1 kg    Examination:  General exam: Appears calm and comfortable. Respiratory system: Clear to auscultation.  Cardiovascular system: S1 & S2 heard. Gastrointestinal system: Abdomen is nondistended, soft and nontender. No organomegaly or masses felt. Normal bowel sounds heard. Central nervous system: Alert and oriented. No focal neurological deficits. Extremities: No leg edema  Data Reviewed: I have personally reviewed following labs and imaging studies  CBC: Recent Labs  Lab 02/10/20 2043 02/10/20 2044 02/11/20 0109 02/11/20 0502  WBC 8.9  --  7.4 6.9  NEUTROABS 5.2  --   --   --   HGB 13.0 12.6* 11.4* 10.9*  HCT 39.8 37.0* 34.6* 33.7*  MCV 99.5  --  97.7 97.7  PLT 256  --  168 025   Basic Metabolic Panel: Recent Labs  Lab 02/10/20 2043 02/10/20 2044 02/11/20 0109 02/11/20 0502  NA 135 136 137  --   K 3.5 3.7 3.9  --   CL 101 103 107  --   CO2 15*  --  20*  --   GLUCOSE 122* 115* 132*  --   BUN 13 13 11   --   CREATININE 1.65* 1.40*  1.44* 1.40*  CALCIUM 8.9  --  8.5*  --    GFR: Estimated Creatinine Clearance: 44.2 mL/min (A) (by C-G formula based on SCr of 1.4 mg/dL (H)). Liver Function Tests: Recent Labs  Lab 02/10/20 2043 02/11/20 0502  AST 32 24  ALT 25 23  ALKPHOS 84 68  BILITOT 0.7 0.7  PROT 7.3 5.9*  ALBUMIN 3.2* 2.7*   No results for input(s): LIPASE, AMYLASE in the last 168 hours. No results for input(s): AMMONIA in the last 168 hours. Coagulation Profile: Recent Labs  Lab 02/10/20 2043  INR 1.1   Cardiac  Enzymes: No results for input(s): CKTOTAL, CKMB, CKMBINDEX, TROPONINI in the last 168 hours. BNP (last 3 results) No results for input(s): PROBNP in the last 8760 hours. HbA1C: No results for input(s): HGBA1C in the last 72 hours. CBG: Recent Labs  Lab 02/10/20 2038 02/10/20 2258  GLUCAP 121* 137*   Lipid Profile: No results for input(s): CHOL, HDL, LDLCALC, TRIG, CHOLHDL, LDLDIRECT in the last 72 hours. Thyroid Function Tests: No results for input(s): TSH, T4TOTAL, FREET4, T3FREE, THYROIDAB in the last 72 hours. Anemia Panel: No results for input(s): VITAMINB12, FOLATE, FERRITIN, TIBC, IRON, RETICCTPCT in the last 72 hours. Urine analysis:    Component Value Date/Time   COLORURINE YELLOW 02/11/2020 0611   APPEARANCEUR CLEAR 02/11/2020 0611   LABSPEC 1.014 02/11/2020 0611   PHURINE 5.0 02/11/2020 0611   GLUCOSEU NEGATIVE 02/11/2020 0611   HGBUR SMALL (A) 02/11/2020 0611   BILIRUBINUR NEGATIVE 02/11/2020 0611   KETONESUR NEGATIVE 02/11/2020 0611   PROTEINUR NEGATIVE 02/11/2020 0611   NITRITE POSITIVE (A) 02/11/2020 0611   LEUKOCYTESUR MODERATE (A) 02/11/2020 0611   Sepsis Labs: @LABRCNTIP (procalcitonin:4,lacticidven:4)  ) Recent Results (from the past 240 hour(s))  SARS Coronavirus 2 by RT PCR (hospital order, performed in [facility] hospital lab) Nasopharyngeal Nasopharyngeal Swab     Status: None   Collection Time: 02/10/20 11:16 PM   Specimen: Nasopharyngeal Swab  Result Value Ref Range Status   SARS Coronavirus 2 NEGATIVE NEGATIVE Final    Comment: (NOTE) SARS-CoV-2 target nucleic acids are NOT DETECTED.  The SARS-CoV-2 RNA is generally detectable in upper and lower respiratory specimens during the acute phase of infection. The lowest concentration of SARS-CoV-2 viral copies this assay can detect is 250 copies / mL. A negative result does not preclude SARS-CoV-2 infection and should not be used as the sole basis for treatment or other patient management  decisions.  A negative result may occur with improper specimen collection / handling, submission of specimen other than nasopharyngeal swab, presence of viral mutation(s) within the areas targeted by this assay, and inadequate number of viral copies (<250 copies / mL). A negative result must be combined with clinical observations, patient history, and epidemiological information.  Fact Sheet for Patients:   StrictlyIdeas.no  Fact Sheet for Healthcare Providers: BankingDealers.co.za  This test is not yet approved or  cleared by the [place] FDA and has been authorized for detection and/or diagnosis of SARS-CoV-2 by FDA under an Emergency Use Authorization (EUA).  This EUA will remain in effect (meaning this test can be used) for the duration of the COVID-19 declaration under Section 564(b)(1) of the Act, 21 U.S.C. section 360bbb-3(b)(1), unless the authorization is terminated or revoked sooner.  Performed at [facility], [place] [address] [place], [place] [address]          Radiology Studies: MR BRAIN WO CONTRAST  Result Date: 02/11/2020 CLINICAL DATA:  Seizure with  abnormal neuro exam EXAM: MRI HEAD WITHOUT CONTRAST TECHNIQUE: Multiplanar, multiecho pulse sequences of the brain and surrounding structures were obtained without intravenous contrast. COMPARISON:  Head CT from yesterday FINDINGS: Brain: No acute infarction, hemorrhage, hydrocephalus, extra-axial collection or mass lesion. Cerebral volume loss which is mild for age. Chronic small vessel ischemia that is confluent in the biparietal white matter. No focal cortical finding to explain seizure. Unremarkable appearance of the bilateral hippocampus. Small remote bilateral cerebellar infarcts. Vascular: Preserved flow voids. Small basilar likely reflecting bilateral fetal type PCA. Skull and upper cervical spine: No evidence of marrow lesion. Sinuses/Orbits: Mild patchy  mucosal thickening in the paranasal sinuses. IMPRESSION: 1. No acute or reversible finding. No focal cortical finding to explain seizure. 2. 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 cerebellar infarcts. Electronically Signed   By: Monte Fantasia M.D.   On: 02/11/2020 07:53   CT HEAD CODE STROKE WO CONTRAST  Result Date: 02/10/2020 CLINICAL DATA:  Code stroke. Neuro deficit, acute, stroke suspected. EXAM: CT HEAD WITHOUT CONTRAST TECHNIQUE: Contiguous axial images were obtained from the base of the skull through the vertex without intravenous contrast. COMPARISON:  None. FINDINGS: Brain: No acute cortically based infarct, intracranial hemorrhage, mass, midline shift, or extra-axial fluid collection is identified. Mild cerebral atrophy is within normal limits for age. Patchy hypodensities in the cerebral white matter bilaterally are nonspecific but compatible with mild-to-moderate chronic small vessel ischemic disease. There are one or two suspected chronic lacunar infarcts along the left caudate body and periventricular white matter. A subcentimeter left cerebellar infarct also is chronic in appearance. Vascular: Calcified atherosclerosis at the skull base. No hyperdense vessel. Skull: No fracture or suspicious osseous lesion. Sinuses/Orbits: Minimal mucosal thickening in the ethmoid sinuses. Clear mastoid air cells. Unremarkable orbits. Other: None. ASPECTS [facility] Stroke Program Early CT Score) - Ganglionic level infarction (caudate, lentiform nuclei, internal capsule, insula, M1-M3 cortex): 7 - Supraganglionic infarction (M4-M6 cortex): 3 Total score (0-10 with 10 being normal): 10 IMPRESSION: 1. No evidence of acute intracranial abnormality. 2. ASPECTS is 10. 3. Mild-to-moderate chronic small vessel ischemic disease. These results were communicated to Dr. Leonel Ramsay at 8:59 pm on 02/10/2020 by text page via the [facility] messaging system. Electronically Signed   By: Logan Bores M.D.   On:  02/10/2020 21:00        Scheduled Meds: . enoxaparin (LOVENOX) injection  40 mg Subcutaneous QHS  . folic acid  1 mg Oral Daily  . levETIRAcetam  500 mg Oral BID  . multivitamin with minerals  1 tablet Oral Daily  . thiamine injection  100 mg Intravenous Daily  . thiamine  100 mg Oral Daily   Or  . thiamine  100 mg Intravenous Daily   Continuous Infusions: . sodium chloride 75 mL/hr at 02/11/20 0825     LOS: 0 days    Time spent: 35 minutes.  Dana Allan, MD  [facility] Pager #: [phone] 7PM-7AM contact night coverage as above

## 2020-02-11 NOTE — Procedures (Signed)
Patient Name: Robert Blackwell  MRN: [id number]  Epilepsy Attending: Lora Havens  Referring Physician/Provider: Dr Gean Birchwood Date: 02/11/2020 Duration: 24.39 mins  Patient history: : 79 year old male with new onset seizures, now with two separate seizures. EEG to evaluate for seizure.  Level of alertness: Awake,  Asleep  AEDs during EEG study: LEV  Technical aspects: This EEG study was done with scalp electrodes positioned according to the 10-20 International system of electrode placement. Electrical activity was acquired at a sampling rate of 500Hz  and reviewed with a high frequency filter of 70Hz  and a low frequency filter of 1Hz . EEG data were recorded continuously and digitally stored.   Description: The posterior dominant rhythm consists of 8-9Hz  activity of moderate voltage (25-35 uV) seen predominantly in posterior head regions, symmetric and reactive to eye opening and eye closing. Sleep was characterized by vertex waves, sleep spindles (12 to 14 Hz), maximal frontocentral region. Hyperventilation and photic stimulation were not performed.     IMPRESSION: This study is within normal limits. No seizures or epileptiform discharges were seen throughout the recording.  [name] [name]

## 2020-02-12 ENCOUNTER — Observation Stay (HOSPITAL_COMMUNITY): Payer: Medicare Other

## 2020-02-12 DIAGNOSIS — B961 Klebsiella pneumoniae [K. pneumoniae] as the cause of diseases classified elsewhere: Secondary | ICD-10-CM | POA: Diagnosis present

## 2020-02-12 DIAGNOSIS — R569 Unspecified convulsions: Secondary | ICD-10-CM | POA: Diagnosis not present

## 2020-02-12 DIAGNOSIS — Z20822 Contact with and (suspected) exposure to covid-19: Secondary | ICD-10-CM | POA: Diagnosis present

## 2020-02-12 DIAGNOSIS — F17200 Nicotine dependence, unspecified, uncomplicated: Secondary | ICD-10-CM | POA: Diagnosis present

## 2020-02-12 DIAGNOSIS — E785 Hyperlipidemia, unspecified: Secondary | ICD-10-CM | POA: Diagnosis present

## 2020-02-12 DIAGNOSIS — Z7289 Other problems related to lifestyle: Secondary | ICD-10-CM | POA: Diagnosis not present

## 2020-02-12 DIAGNOSIS — F1020 Alcohol dependence, uncomplicated: Secondary | ICD-10-CM | POA: Diagnosis present

## 2020-02-12 DIAGNOSIS — N1831 Chronic kidney disease, stage 3a: Secondary | ICD-10-CM | POA: Diagnosis present

## 2020-02-12 DIAGNOSIS — I2510 Atherosclerotic heart disease of native coronary artery without angina pectoris: Secondary | ICD-10-CM | POA: Diagnosis not present

## 2020-02-12 DIAGNOSIS — R4182 Altered mental status, unspecified: Secondary | ICD-10-CM | POA: Diagnosis present

## 2020-02-12 DIAGNOSIS — I739 Peripheral vascular disease, unspecified: Secondary | ICD-10-CM | POA: Diagnosis present

## 2020-02-12 DIAGNOSIS — N390 Urinary tract infection, site not specified: Secondary | ICD-10-CM | POA: Diagnosis present

## 2020-02-12 DIAGNOSIS — R000 Tachycardia, unspecified: Secondary | ICD-10-CM | POA: Diagnosis present

## 2020-02-12 DIAGNOSIS — I959 Hypotension, unspecified: Secondary | ICD-10-CM | POA: Diagnosis present

## 2020-02-12 DIAGNOSIS — N179 Acute kidney failure, unspecified: Secondary | ICD-10-CM | POA: Diagnosis present

## 2020-02-12 LAB — ECHOCARDIOGRAM COMPLETE
Area-P 1/2: 2.83 cm2
Height: 70 in
S' Lateral: 3.2 cm
Weight: 2634.94 oz

## 2020-02-12 LAB — MAGNESIUM: Magnesium: 1.8 mg/dL (ref 1.7–2.4)

## 2020-02-12 LAB — RENAL FUNCTION PANEL
Albumin: 2.6 g/dL — ABNORMAL LOW (ref 3.5–5.0)
Anion gap: 11 (ref 5–15)
BUN: 8 mg/dL (ref 8–23)
CO2: 21 mmol/L — ABNORMAL LOW (ref 22–32)
Calcium: 8.6 mg/dL — ABNORMAL LOW (ref 8.9–10.3)
Chloride: 106 mmol/L (ref 98–111)
Creatinine, Ser: 1.35 mg/dL — ABNORMAL HIGH (ref 0.61–1.24)
GFR calc Af Amer: 57 mL/min — ABNORMAL LOW (ref >60)
GFR calc non Af Amer: 50 mL/min — ABNORMAL LOW (ref >60)
Glucose, Bld: 109 mg/dL — ABNORMAL HIGH (ref 70–99)
Phosphorus: 3.2 mg/dL (ref 2.5–4.6)
Potassium: 3.9 mmol/L (ref 3.5–5.1)
Sodium: 138 mmol/L (ref 135–145)

## 2020-02-12 LAB — CBC WITH DIFFERENTIAL/PLATELET
Abs Immature Granulocytes: 0.02 10*3/uL (ref 0.00–0.07)
Basophils Absolute: 0 10*3/uL (ref 0.0–0.1)
Basophils Relative: 0 %
Eosinophils Absolute: 0.1 10*3/uL (ref 0.0–0.5)
Eosinophils Relative: 1 %
HCT: 35.9 % — ABNORMAL LOW (ref 39.0–52.0)
Hemoglobin: 11.8 g/dL — ABNORMAL LOW (ref 13.0–17.0)
Immature Granulocytes: 0 %
Lymphocytes Relative: 24 %
Lymphs Abs: 1.3 10*3/uL (ref 0.7–4.0)
MCH: 31.8 pg (ref 26.0–34.0)
MCHC: 32.9 g/dL (ref 30.0–36.0)
MCV: 96.8 fL (ref 80.0–100.0)
Monocytes Absolute: 0.6 10*3/uL (ref 0.1–1.0)
Monocytes Relative: 10 %
Neutro Abs: 3.5 10*3/uL (ref 1.7–7.7)
Neutrophils Relative %: 65 %
Platelets: 172 10*3/uL (ref 150–400)
RBC: 3.71 MIL/uL — ABNORMAL LOW (ref 4.22–5.81)
RDW: 12.8 % (ref 11.5–15.5)
WBC: 5.5 10*3/uL (ref 4.0–10.5)
nRBC: 0 % (ref 0.0–0.2)

## 2020-02-12 LAB — TROPONIN I (HIGH SENSITIVITY)
Troponin I (High Sensitivity): 20 ng/L — ABNORMAL HIGH (ref <18)
Troponin I (High Sensitivity): 18 ng/L — ABNORMAL HIGH (ref <18)

## 2020-02-12 MED ORDER — COSYNTROPIN 0.25 MG IJ SOLR
0.2500 mg | Freq: Once | INTRAMUSCULAR | Status: DC
  Filled 2020-02-12: qty 0.25

## 2020-02-12 NOTE — Progress Notes (Signed)
NEUROLOGY CONSULTATION PROGRESS NOTE   Date of service: February 12, 2020 Patient Name: Robert Blackwell MRN:  [id number] DOB:  1940/10/30   Interval Hx   No more seizures in the hospital.  Vitals  Temp:  [97.6 F (36.4 C)-97.9 F (36.6 C)] 97.7 F (36.5 C) (09/05 0445) Pulse Rate:  [49-57] 52 (09/05 0445) Resp:  [13-19] 16 (09/05 0445) BP: (84-104)/(45-68) 94/58 (09/05 0445) SpO2:  [95 %-100 %] 96 % (09/05 0445) Weight:  [74.7 kg] 74.7 kg (09/05 0148)  Body mass index is 23.63 kg/m.  Physical Exam   Neurologic Examination  Mental status/Cognition: Alert, oriented to self, place, month and year, good attention. Speech/language: Fluent, comprehension intact, object naming intact, repetition intact. Cranial nerves:   CN II Pupils equal and reactive to light, no VF deficits   CN III,IV,VI EOM intact, no gaze preference or deviation, no nystagmus   CN V normal sensation in V1, V2, and V3 segments bilaterally    CN VII no asymmetry, no nasolabial fold flattening    CN VIII normal hearing to speech    CN IX & X normal palatal elevation, no uvular deviation    CN XI 5/5 head turn and 5/5 shoulder shrug bilaterally    CN XII midline tongue protrusion    Motor:  Muscle bulk: poor , tone normal Mvmt Root Nerve  Muscle Right Left Comments  SA C5/6 Ax Deltoid 5 5   EF C5/6 Mc Biceps 5 5   EE C6/7/8 Rad Triceps 5 5   WF C6/7 Med FCR 5 5   WE C7/8 PIN ECU 5 5   F Ab C8/T1 U ADM/FDI 5 5   HF L1/2/3 Fem Illopsoas 5 5   KE L2/3/4 Fem Quad 5 5   DF L4/5 D Peron Tib Ant 5 5   PF S1/2 Tibial Grc/Sol 5 5     Sensation:  Light touch Intact throughout   Pin prick    Temperature    Vibration   Proprioception    Coordination/Complex Motor:  - Finger to Nose intact BL   Labs   Lab Results  Component Value Date   NA 137 02/11/2020   K 3.9 02/11/2020   CL 107 02/11/2020   CO2 20 (L) 02/11/2020   GLUCOSE 132 (H) 02/11/2020   BUN 11 02/11/2020   CREATININE 1.40 (H) 02/11/2020    CALCIUM 8.5 (L) 02/11/2020   ALBUMIN 2.7 (L) 02/11/2020   AST 24 02/11/2020   ALT 23 02/11/2020   ALKPHOS 68 02/11/2020   BILITOT 0.7 02/11/2020   GFRNONAA 47 (L) 02/11/2020   GFRAA 55 (L) 02/11/2020     Imaging and Diagnostic studies  MRI Brain without contrast: 1. No acute or reversible finding. No focal cortical finding to explain seizure. 2. 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 cerebellar infarcts.  rEEG: This study is within normal limits. No seizures or epileptiform discharges were seen throughout the recording.  Impression   Robert Blackwell is a 79 y.o. male with PMH significant for EtOH use who presents with 2 generalized tonic-clonic seizures.  Vitals with bradycardia and hypotension here.  He was given Keppra load and started on 500 mg twice a day of Keppra.  Labs with no significant electrolyte abnormality.  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  Work-up with MRI brain without contrast with no significant abnormality and  a routine EEG was negative for any epileptogenic discharges.  I did discuss with him that he should not drive for 6 months and needs to be cleared by neurology. He  should observe precautions including making sure that he is not swimming by himself, not bathing in a bathtub full of water, kitchen safety and safety with small kids and to be careful with heavy machinery.  I discussed with him that if he is contemplating quitting alcohol, he should do that in a safer inpatient setting like a rehab where he can be monitored for seizures.  I discussed that abruptly quitting alcohol will make him high risk for seizures over a period of 72 hours and may not be safe for him to quit alcohol at home as he may end up having a prolonged seizure.  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 The safest choice it to quit alcohol with monitoring in an inpatient setting.  Recommendations  - continue Keppra 500mg  BID - Follow up with neurology outpatient. - No driving for 6 months and needs to be cleared by neurology. - The patient was given instructions regarding safety in persons with seizures, specifically including no driving, no taking tub baths, no climbing heights or swimming due to risk of injury from seizures. - If contemplating quitting alcohol, this should be done in an inpatient setting due to risk of seizures in acute period. -Neurology inpatient team will sign off.  Please feel free to contact us with any questions or concerns. ______________________________________________________________________   Thank you for the opportunity to take part in the care of this patient. If you have any further questions, please contact the neurology consultation attending.  Signed,  Media Pager Number [phone]

## 2020-02-12 NOTE — Progress Notes (Signed)
PROGRESS NOTE    HAZIEL MOLNER  NOM:[id number] DOB: November 11, 1940 DOA: 02/10/2020 PCP: Jefm Petty, MD  Outpatient Specialists:   Brief Narrative:  Patient is a 79 year old male with past medical history significant for peripheral vascular disease status post left lower extremity bypass, hyperlipidemia, tobacco and alcohol abuse (patient drinks 4 beers every day).  Patient was admitted with seizure.  Another tonic-clonic seizure was noted in the ER.  Neurology was consulted and patient has been on Keppra.  UA suggestive of UTI.  Will send urine for culture, and will start patient on IV Rocephin.  Patient was also noted to be hypotensive, will check cortisol, get an echocardiogram and cycle cardiac enzymes.  Will hydrate patient.  02/12/2020: Urine cultures growing Klebsiella.  Follow final urine culture results.  Cortisol is 8.  We will proceed with cosyntropin test.  Echocardiogram is nonrevealing.  EEG is normal.  Blood pressure continues to run low normal.  According to the patient's wife, patient has always had the low normal blood pressure.  We will follow final cultures and the result of cosyntropin test.  Further management will depend on hospital course.  Assessment & Plan:   Principal Problem:   Seizure ([address]) Active Problems:   AKI (acute kidney injury) ([place])  UTI/hypotension/SIRS: -Follow urine culture. -Hydrate patient. -IV Rocephin. -Check cortisol. -Echocardiogram. -Trend cardiac enzymes. -Further management depend on hospital course. 02/12/2020: Blood pressure is low normal.  Urine culture is growing Klebsiella.  Follow final urine cultures.  Continue IV antibiotics for now.  Troponin trended today, seems to be improving.  Suspect type II elevation of troponin/MI.  Seizures new onset: -Neurology is directing care. -Currently on Keppra. -Patient is an alcoholic. -MRI brain noted. -Follow EEG. 02/12/2020: EEG is normal.  No further seizures noted.  Alcohol abuse,  continuous: -On thiamine and CIWA protocol -Counseled to quit alcohol.  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 02/12/2020: Serum creatinine is down to 1.35.  Continue to follow renal function and electrolytes.  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    History of peripheral artery disease status post left-sided bypass: -No acute issues at this time takes aspirin at home. -Stable.  Tobacco use disorder: -Counseled.  Hyperlipidemia.  DVT prophylaxis: Subacute Lovenox Code Status: Full code Family Communication: Patient's wife Disposition Plan: This will depend on hospital course   Consultants:   Neurology  Procedures:   None  Antimicrobials:   IV Rocephin   Subjective: No new complaints Blood pressure remains on the low side  Objective: Vitals:   02/12/20 0445 02/12/20 0819 02/12/20 1217 02/12/20 1652  BP: (!) 94/58 (!) 106/59 (!) 92/54 (!) 112/51  Pulse: (!) 52  61 (!) 57  Resp: 16  20 14   Temp: 97.7 F (36.5 C) 97.7 F (36.5 C) 98.1 F (36.7 C) 98.2 F (36.8 C)  TempSrc: Oral Oral Oral Oral  SpO2: 96%  96% 97%  Weight:      Height:        Intake/Output Summary (Last 24 hours) at 02/12/2020 1833 Last data filed at 02/12/2020 0600 Gross per 24 hour  Intake 100 ml  Output 1 ml  Net 99 ml   Filed Weights   02/10/20 2000 02/10/20 2101 02/12/20 0148  Weight: 75 kg 77.1 kg 74.7 kg    Examination:  General exam: Appears calm and comfortable. Respiratory  system: Clear to auscultation.  Cardiovascular system: S1 & S2 heard. Gastrointestinal system: Abdomen is nondistended, soft and nontender. No organomegaly or masses felt. Normal bowel sounds heard. Central nervous system: Alert and oriented. No focal neurological deficits. Extremities: No leg edema  Data Reviewed: I  have personally reviewed following labs and imaging studies  CBC: Recent Labs  Lab 02/10/20 2043 02/10/20 2044 02/11/20 0109 02/11/20 0502 02/12/20 1442  WBC 8.9  --  7.4 6.9 5.5  NEUTROABS 5.2  --   --   --  3.5  HGB 13.0 12.6* 11.4* 10.9* 11.8*  HCT 39.8 37.0* 34.6* 33.7* 35.9*  MCV 99.5  --  97.7 97.7 96.8  PLT 256  --  168 192 633   Basic Metabolic Panel: Recent Labs  Lab 02/10/20 2043 02/10/20 2044 02/11/20 0109 02/11/20 0502 02/12/20 1442  NA 135 136 137  --  138  K 3.5 3.7 3.9  --  3.9  CL 101 103 107  --  106  CO2 15*  --  20*  --  21*  GLUCOSE 122* 115* 132*  --  109*  BUN 13 13 11   --  8  CREATININE 1.65* 1.40* 1.44* 1.40* 1.35*  CALCIUM 8.9  --  8.5*  --  8.6*  MG  --   --   --   --  1.8  PHOS  --   --   --   --  3.2   GFR: Estimated Creatinine Clearance: 45.8 mL/min (A) (by C-G formula based on SCr of 1.35 mg/dL (H)). Liver Function Tests: Recent Labs  Lab 02/10/20 2043 02/11/20 0502 02/12/20 1442  AST 32 24  --   ALT 25 23  --   ALKPHOS 84 68  --   BILITOT 0.7 0.7  --   PROT 7.3 5.9*  --   ALBUMIN 3.2* 2.7* 2.6*   No results for input(s): LIPASE, AMYLASE in the last 168 hours. No results for input(s): AMMONIA in the last 168 hours. Coagulation Profile: Recent Labs  Lab 02/10/20 2043  INR 1.1   Cardiac Enzymes: No results for input(s): CKTOTAL, CKMB, CKMBINDEX, TROPONINI in the last 168 hours. BNP (last 3 results) No results for input(s): PROBNP in the last 8760 hours. HbA1C: No results for input(s): HGBA1C in the last 72 hours. CBG: Recent Labs  Lab 02/10/20 2038 02/10/20 2258  GLUCAP 121* 137*   Lipid Profile: No results for input(s): CHOL, HDL, LDLCALC, TRIG, CHOLHDL, LDLDIRECT in the last 72 hours. Thyroid Function Tests: No results for input(s): TSH, T4TOTAL, FREET4, T3FREE, THYROIDAB in the last 72 hours. Anemia Panel: No results for input(s): VITAMINB12, FOLATE, FERRITIN, TIBC, IRON, RETICCTPCT in the last 72  hours. Urine analysis:    Component Value Date/Time   COLORURINE YELLOW 02/11/2020 0611   APPEARANCEUR CLEAR 02/11/2020 0611   LABSPEC 1.014 02/11/2020 0611   PHURINE 5.0 02/11/2020 0611   GLUCOSEU NEGATIVE 02/11/2020 0611   HGBUR SMALL (A) 02/11/2020 0611   BILIRUBINUR NEGATIVE 02/11/2020 0611   KETONESUR NEGATIVE 02/11/2020 0611   PROTEINUR NEGATIVE 02/11/2020 0611   NITRITE POSITIVE (A) 02/11/2020 0611   LEUKOCYTESUR MODERATE (A) 02/11/2020 0611   Sepsis Labs: @LABRCNTIP (procalcitonin:4,lacticidven:4)  ) Recent Results (from the past 240 hour(s))  SARS Coronavirus 2 by RT PCR (hospital order, performed in [facility] hospital lab) Nasopharyngeal Nasopharyngeal Swab     Status: None   Collection Time: 02/10/20 11:16 PM   Specimen: Nasopharyngeal Swab  Result Value Ref Range Status   SARS Coronavirus  2 NEGATIVE NEGATIVE Final    Comment: (NOTE) SARS-CoV-2 target nucleic acids are NOT DETECTED.  The SARS-CoV-2 RNA is generally detectable in upper and lower respiratory specimens during the acute phase of infection. The lowest concentration of SARS-CoV-2 viral copies this assay can detect is 250 copies / mL. A negative result does not preclude SARS-CoV-2 infection and should not be used as the sole basis for treatment or other patient management decisions.  A negative result may occur with improper specimen collection / handling, submission of specimen other than nasopharyngeal swab, presence of viral mutation(s) within the areas targeted by this assay, and inadequate number of viral copies (<250 copies / mL). A negative result must be combined with clinical observations, patient history, and epidemiological information.  Fact Sheet for Patients:   StrictlyIdeas.no  Fact Sheet for Healthcare Providers: BankingDealers.co.za  This test is not yet approved or  cleared by the [place] FDA and has been authorized for  detection and/or diagnosis of SARS-CoV-2 by FDA under an Emergency Use Authorization (EUA).  This EUA will remain in effect (meaning this test can be used) for the duration of the COVID-19 declaration under Section 564(b)(1) of the Act, 21 U.S.C. section 360bbb-3(b)(1), unless the authorization is terminated or revoked sooner.  Performed at [facility], [place] [address] [place], [place] [address]   Culture, blood (routine x 2)     Status: None (Preliminary result)   Collection Time: 02/11/20  5:04 AM   Specimen: BLOOD LEFT FOREARM  Result Value Ref Range Status   Specimen Description BLOOD LEFT FOREARM  Final   Special Requests   Final    BOTTLES DRAWN AEROBIC AND ANAEROBIC Blood Culture adequate volume   Culture   Final    NO GROWTH 1 DAY Performed at [facility], [place] [address], [place], [place] [address]    Report Status PENDING  Incomplete  Culture, blood (routine x 2)     Status: Abnormal (Preliminary result)   Collection Time: 02/11/20  5:05 AM   Specimen: BLOOD  Result Value Ref Range Status   Specimen Description BLOOD LEFT ANTECUBITAL  Final   Special Requests   Final    BOTTLES DRAWN AEROBIC AND ANAEROBIC Blood Culture adequate volume   Culture  Setup Time (A)  Final    GRAM VARIABLE ROD GRAM POSITIVE COCCI IN CLUSTERS ANAEROBIC BOTTLE ONLY CRITICAL RESULT CALLED TO, READ BACK BY AND VERIFIED WITH: R RUMBARGER,PHARMD AT 2101 02/11/20 BY L BENFIELD Performed at [facility], [place] [address] [place], [place] [address]    Culture Lonell Grandchild VARIABLE ROD GRAM POSITIVE COCCI  (A)  Final   Report Status PENDING  Incomplete  Blood Culture ID Panel (Reflexed)     Status: Abnormal   Collection Time: 02/11/20  5:05 AM  Result Value Ref Range Status   Enterococcus faecalis NOT DETECTED NOT DETECTED Final   Enterococcus Faecium NOT DETECTED NOT DETECTED Final   Listeria monocytogenes NOT DETECTED NOT DETECTED Final   Staphylococcus species DETECTED (A) NOT  DETECTED Final    Comment: CRITICAL RESULT CALLED TO, READ BACK BY AND VERIFIED WITH: R RUMBARGER,PHARMD AT 2101 02/11/20 BY L BENFIELD    Staphylococcus aureus (BCID) NOT DETECTED NOT DETECTED Final   Staphylococcus epidermidis DETECTED (A) NOT DETECTED Final    Comment: Methicillin (oxacillin) resistant coagulase negative staphylococcus. Possible blood culture contaminant (unless isolated from more than one blood culture draw or clinical case suggests pathogenicity). No antibiotic treatment is indicated  for blood  culture contaminants. CRITICAL RESULT CALLED TO, READ BACK BY AND VERIFIED WITH: R RUMBARGER,PHARMD AT 2101 02/11/20 BY L BENFIELD    Staphylococcus lugdunensis NOT DETECTED NOT DETECTED Final   Streptococcus species NOT DETECTED NOT DETECTED Final   Streptococcus agalactiae NOT DETECTED NOT DETECTED Final   Streptococcus pneumoniae NOT DETECTED NOT DETECTED Final   Streptococcus pyogenes NOT DETECTED NOT DETECTED Final   A.calcoaceticus-baumannii NOT DETECTED NOT DETECTED Final   Bacteroides fragilis NOT DETECTED NOT DETECTED Final   Enterobacterales NOT DETECTED NOT DETECTED Final   Enterobacter cloacae complex NOT DETECTED NOT DETECTED Final   Escherichia coli NOT DETECTED NOT DETECTED Final   Klebsiella aerogenes NOT DETECTED NOT DETECTED Final   Klebsiella oxytoca NOT DETECTED NOT DETECTED Final   Klebsiella pneumoniae NOT DETECTED NOT DETECTED Final   Proteus species NOT DETECTED NOT DETECTED Final   Salmonella species NOT DETECTED NOT DETECTED Final   Serratia marcescens NOT DETECTED NOT DETECTED Final   Haemophilus influenzae NOT DETECTED NOT DETECTED Final   Neisseria meningitidis NOT DETECTED NOT DETECTED Final   Pseudomonas aeruginosa NOT DETECTED NOT DETECTED Final   Stenotrophomonas maltophilia NOT DETECTED NOT DETECTED Final   Candida albicans NOT DETECTED NOT DETECTED Final   Candida auris NOT DETECTED NOT DETECTED Final   Candida glabrata NOT DETECTED NOT  DETECTED Final   Candida krusei NOT DETECTED NOT DETECTED Final   Candida parapsilosis NOT DETECTED NOT DETECTED Final   Candida tropicalis NOT DETECTED NOT DETECTED Final   Cryptococcus neoformans/gattii NOT DETECTED NOT DETECTED Final   Methicillin resistance mecA/C DETECTED (A) NOT DETECTED Final    Comment: CRITICAL RESULT CALLED TO, READ BACK BY AND VERIFIED WITH: R RUMBARGER,PHARMD AT 2101 02/11/20 BY L BENFIELD Performed at [facility], [address] [place], [place] [address]   Culture, Urine     Status: Abnormal (Preliminary result)   Collection Time: 02/11/20  4:55 PM   Specimen: Urine, Random  Result Value Ref Range Status   Specimen Description URINE, RANDOM  Final   Special Requests   Final    NONE Performed at [facility], [place] [address] [place], [place] [address]    Culture >=100,000 COLONIES/mL KLEBSIELLA OXYTOCA (A)  Final   Report Status PENDING  Incomplete         Radiology Studies: EEG  Result Date: 02/11/2020 Lora Havens, MD     02/11/2020 11:27 PM Patient Name: JAREL CUADRA MRN: [id number] Epilepsy Attending: Lora Havens Referring Physician/Provider: Dr Gean Birchwood Date: 02/11/2020 Duration: 24.39 mins Patient history: : 79 year old male with new onset seizures, now with two separate seizures. EEG to evaluate for seizure. Level of alertness: Awake,  Asleep AEDs during EEG study: LEV Technical aspects: This EEG study was done with scalp electrodes positioned according to the 10-20 International system of electrode placement. Electrical activity was acquired at a sampling rate of 500Hz  and reviewed with a high frequency filter of 70Hz  and a low frequency filter of 1Hz . EEG data were recorded continuously and digitally stored. Description: The posterior dominant rhythm consists of 8-9Hz  activity of moderate voltage (25-35 uV) seen predominantly in posterior head regions, symmetric and reactive to eye opening and eye closing. Sleep was  characterized by vertex waves, sleep spindles (12 to 14 Hz), maximal frontocentral region. Hyperventilation and photic stimulation were not performed.   IMPRESSION: This study is within normal limits. No seizures or epileptiform discharges were seen throughout the recording. Priyanka [name]  MR BRAIN WO CONTRAST  Result Date: 02/11/2020 CLINICAL DATA:  Seizure with abnormal neuro exam EXAM: MRI HEAD WITHOUT CONTRAST TECHNIQUE: Multiplanar, multiecho pulse sequences of the brain and surrounding structures were obtained without intravenous contrast. COMPARISON:  Head CT from yesterday FINDINGS: Brain: No acute infarction, hemorrhage, hydrocephalus, extra-axial collection or mass lesion. Cerebral volume loss which is mild for age. Chronic small vessel ischemia that is confluent in the biparietal white matter. No focal cortical finding to explain seizure. Unremarkable appearance of the bilateral hippocampus. Small remote bilateral cerebellar infarcts. Vascular: Preserved flow voids. Small basilar likely reflecting bilateral fetal type PCA. Skull and upper cervical spine: No evidence of marrow lesion. Sinuses/Orbits: Mild patchy mucosal thickening in the paranasal sinuses. IMPRESSION: 1. No acute or reversible finding. No focal cortical finding to explain seizure. 2. 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 cerebellar infarcts. Electronically Signed   By: Monte Fantasia M.D.   On: 02/11/2020 07:53   DG CHEST PORT 1 VIEW  Result Date: 02/11/2020 CLINICAL DATA:  Systemic inflammatory response syndrome. EXAM: PORTABLE CHEST 1 VIEW COMPARISON:  None available. FINDINGS: 1215 hours. Two views are submitted. The heart is mildly enlarged. There is aortic atherosclerosis. There is vascular congestion without overt pulmonary edema, confluent airspace opacity or pneumothorax. Mild blunting of both costophrenic angles is present without large pleural effusion. The bones appear unremarkable. Telemetry  leads overlie the chest. IMPRESSION: Cardiomegaly with vascular congestion and possible small pleural effusions. No overt pulmonary edema. Electronically Signed   By: Richardean Sale M.D.   On: 02/11/2020 12:25   ECHOCARDIOGRAM COMPLETE  Result Date: 02/12/2020    ECHOCARDIOGRAM REPORT   Patient Name:   AMAN BONET [facility] Date of Exam: 02/12/2020 Medical Rec #:  [id number]   Height:       70.0 in Accession #:    [id number]  Weight:       164.7 lb Date of Birth:  [birth year]   BSA:          1.922 m Patient Age:    44 years    BP:           106/59 mmHg Patient Gender: M           HR:           52 bpm. 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  Sonographer:    Mikki Santee RDCS (AE) Referring Phys: [place]  1. Left ventricular ejection fraction, by estimation, is 55 to 60%. The left ventricle has normal function. The left ventricle has no regional wall motion abnormalities. There is mild left ventricular hypertrophy. Left ventricular diastolic parameters were normal.  2. Right ventricular systolic function is normal. The right ventricular size is mildly enlarged. There is normal pulmonary artery systolic pressure. The estimated right ventricular systolic pressure is 23.5 mmHg.  3. Left atrial size was mildly dilated.  4. The mitral valve is normal in structure. Trivial mitral valve regurgitation.  5. The aortic valve was not well visualized. Aortic valve regurgitation is not visualized. No aortic stenosis is present.  6. The inferior vena cava is dilated in size with >50% respiratory variability, suggesting right atrial pressure of 8 mmHg. FINDINGS  Left Ventricle: Left ventricular ejection fraction, by estimation, is 55 to 60%. The left ventricle has normal function. The left ventricle has no  regional wall motion abnormalities. The left ventricular internal cavity size was  normal in size. There is  mild left ventricular hypertrophy. Left ventricular diastolic parameters were normal. Right Ventricle: The right ventricular size is mildly enlarged. No increase in right ventricular wall thickness. Right ventricular systolic function is normal. There is normal pulmonary artery systolic pressure. The tricuspid regurgitant velocity is 2.16  m/s, and with an assumed right atrial pressure of 8 mmHg, the estimated right ventricular systolic pressure is 66.2 mmHg. Left Atrium: Left atrial size was mildly dilated. Right Atrium: Right atrial size was normal in size. Pericardium: There is no evidence of pericardial effusion. Mitral Valve: The mitral valve is normal in structure. Trivial mitral valve regurgitation. Tricuspid Valve: The tricuspid valve is normal in structure. Tricuspid valve regurgitation is trivial. Aortic Valve: The aortic valve was not well visualized. Aortic valve regurgitation is not visualized. No aortic stenosis is present. Pulmonic Valve: The pulmonic valve was not well visualized. Pulmonic valve regurgitation is not visualized. Aorta: The aortic root and ascending aorta are structurally normal, with no evidence of dilitation. Venous: The inferior vena cava is dilated in size with greater than 50% respiratory variability, suggesting right atrial pressure of 8 mmHg. IAS/Shunts: The interatrial septum was not well visualized.  LEFT VENTRICLE PLAX 2D LVIDd:         4.40 cm  Diastology LVIDs:         3.20 cm  LV e' lateral:   12.00 cm/s LV PW:         1.10 cm  LV E/e' lateral: 8.4 LV IVS:        1.20 cm  LV e' medial:    7.72 cm/s LVOT diam:     2.30 cm  LV E/e' medial:  13.1 LV SV:         102 LV SV Index:   53 LVOT Area:     4.15 cm  RIGHT VENTRICLE TAPSE (M-mode): 2.0 cm LEFT ATRIUM             Index       RIGHT ATRIUM           Index LA diam:        4.10 cm 2.13 cm/m  RA Area:     18.00 cm LA Vol (A2C):   79.7 ml 41.47 ml/m RA Volume:   48.40 ml  25.18 ml/m LA Vol (A4C):    61.4 ml 31.95 ml/m LA Biplane Vol: 73.5 ml 38.24 ml/m  AORTIC VALVE LVOT Vmax:   91.40 cm/s LVOT Vmean:  60.500 cm/s LVOT VTI:    0.246 m  AORTA Ao Root diam: 3.40 cm MITRAL VALVE                TRICUSPID VALVE MV Area (PHT): 2.83 cm     TR Peak grad:   18.7 mmHg MV Decel Time: 268 msec     TR Vmax:        216.00 cm/s MV E velocity: 101.00 cm/s MV A velocity: 58.80 cm/s   SHUNTS MV E/A ratio:  1.72         Systemic VTI:  0.25 m                             Systemic Diam: 2.30 cm Oswaldo Milian MD Electronically signed by Oswaldo Milian MD Signature Date/Time: 02/12/2020/2:04:37 PM    Final    CT HEAD CODE STROKE WO CONTRAST  Result Date: 02/10/2020 CLINICAL DATA:  Code stroke. Neuro deficit, acute, stroke suspected. EXAM: CT HEAD WITHOUT CONTRAST TECHNIQUE: Contiguous axial images were obtained from the base of the skull through the vertex without intravenous contrast. COMPARISON:  None. FINDINGS: Brain: No acute cortically based infarct, intracranial hemorrhage, mass, midline shift, or extra-axial fluid collection is identified. Mild cerebral atrophy is within normal limits for age. Patchy hypodensities in the cerebral white matter bilaterally are nonspecific but compatible with mild-to-moderate chronic small vessel ischemic disease. There are one or two suspected chronic lacunar infarcts along the left caudate body and periventricular white matter. A subcentimeter left cerebellar infarct also is chronic in appearance. Vascular: Calcified atherosclerosis at the skull base. No hyperdense vessel. Skull: No fracture or suspicious osseous lesion. Sinuses/Orbits: Minimal mucosal thickening in the ethmoid sinuses. Clear mastoid air cells. Unremarkable orbits. Other: None. ASPECTS [facility] Stroke Program Early CT Score) - Ganglionic level infarction (caudate, lentiform nuclei, internal capsule, insula, M1-M3 cortex): 7 - Supraganglionic infarction (M4-M6 cortex): 3 Total score (0-10 with 10 being normal):  10 IMPRESSION: 1. No evidence of acute intracranial abnormality. 2. ASPECTS is 10. 3. Mild-to-moderate chronic small vessel ischemic disease. These results were communicated to Dr. Leonel Ramsay at 8:59 pm on 02/10/2020 by text page via the [facility] messaging system. Electronically Signed   By: Logan Bores M.D.   On: 02/10/2020 21:00        Scheduled Meds: . [START ON 02/13/2020] cosyntropin  0.25 mg Intravenous Once  . enoxaparin (LOVENOX) injection  40 mg Subcutaneous QHS  . folic acid  1 mg Oral Daily  . levETIRAcetam  500 mg Oral BID  . multivitamin with minerals  1 tablet Oral Daily  . thiamine injection  100 mg Intravenous Daily  . thiamine  100 mg Oral Daily   Or  . thiamine  100 mg Intravenous Daily   Continuous Infusions: . cefTRIAXone (ROCEPHIN)  IV 1 g (02/12/20 1226)     LOS: 0 days    Time spent: 35 minutes.  Dana Allan, MD  [facility] Pager #: [phone] 7PM-7AM contact night coverage as above

## 2020-02-12 NOTE — Progress Notes (Signed)
Pt arrived to 4E-03 from ED. CHG bath completed and new gown applied. Tele applied and CCMD notified. Pt oriented to room, bed and call bell. Seizure pads placed on bed. VS stable. Pt denies pain. Will continue to monitor.  Adella Hare, RN

## 2020-02-12 NOTE — Progress Notes (Signed)
  Echocardiogram 2D Echocardiogram has been performed.  Robert Blackwell 02/12/2020, 8:57 AM

## 2020-02-13 DIAGNOSIS — Z7289 Other problems related to lifestyle: Secondary | ICD-10-CM

## 2020-02-13 DIAGNOSIS — Z789 Other specified health status: Secondary | ICD-10-CM

## 2020-02-13 DIAGNOSIS — N390 Urinary tract infection, site not specified: Secondary | ICD-10-CM

## 2020-02-13 LAB — URINE CULTURE: Culture: >=100000 — AB

## 2020-02-13 LAB — CULTURE, BLOOD (ROUTINE X 2): Special Requests: ADEQUATE

## 2020-02-13 MED ORDER — CEFDINIR 300 MG PO CAPS: 300.0000 mg | ORAL_CAPSULE | Freq: Two times a day (BID) | ORAL | 0 refills | Status: AC

## 2020-02-13 MED ORDER — LEVETIRACETAM 500 MG PO TABS: 500.0000 mg | ORAL_TABLET | Freq: Two times a day (BID) | ORAL | 0 refills | Status: DC

## 2020-02-13 MED ORDER — FOLIC ACID 1 MG PO TABS: 1.0000 mg | ORAL_TABLET | Freq: Every day | ORAL | 0 refills | Status: DC

## 2020-02-13 MED ORDER — THIAMINE HCL 100 MG PO TABS: 100.0000 mg | ORAL_TABLET | Freq: Every day | ORAL | Status: AC

## 2020-02-13 NOTE — Progress Notes (Signed)
Pt discharged today to home with wife.  Pt's IV removed.  Pt taken off telemetry and CCMD notified.  Pt left with all of their personal belongings.  AVS documentation reviewed with Pt and wife and all questions answered.

## 2020-02-13 NOTE — Discharge Summary (Signed)
Physician Discharge Summary  Robert Blackwell NID:[id number] DOB: 1940/11/07 DOA: 02/10/2020  PCP: Jefm Petty, MD  Admit date: 02/10/2020 Discharge date: 02/13/2020  Admitted From: Home Disposition: Home  Recommendations for Outpatient Follow-up:  1. Follow up with PCP in 1 week 2. Follow up with neurology 3. BMP in 1 week 4. Please follow up on the following pending results: None  Home Health: None Equipment/Devices: None  Discharge Condition: [place] CODE STATUS: Full code Diet recommendation: Heart healthy   Brief/Interim Summary:  Admission HPI written by Rise Patience, MD   Chief Complaint: Seizures.  HPI: Robert Blackwell is a 79 y.o. male with history of peripheral vascular disease status post left lower extremity bypass, hyperlipidemia, tobacco and alcohol abuse drinks 4 beers every day was watching TV with his wife this evening when patient suddenly had a tonic-clonic seizure witnessed by patient's wife. Lasted for about 1 or 2 minutes and patient lost consciousness. EMS was called and patient was brought to the ER. Patient did not have any headache or any focal deficits. Has not had any seizures previously.  ED Course: In the ER patient was back to normal CT head is unremarkable. EKG shows sinus tachycardia. Plan is to discharge patient home and patient again had a tonic-clonic seizure lasted for less than a minute and resolved without any intervention. At this point neurologist advised patient to be loaded with Keppra and admitted for further seizure work-up. At the time of my exam patient is back to alert awake and oriented labs showing creatinine 1.6 no old labs to compare.   Hospital course:  Seizure New-onset. Witnessed episode in the ED. Neurology consulted. Patient started empirically on Keppra IV with load in the ED.  CT head and MRI brain unremarkable for acute findings to account for seizure. EEG obtained on 9/4 and was without evidence of seizure activity.  Per neurology, likely alcohol related. No seizures witnessed over an over 48 hour period. Neurology recommending outpatient neurology follow-up and Keppra 500 mg BID.  Alcohol use Patient with a 2-3 can per day use for many years. Patient with a history of DTs. No evidence of withdrawal while inpatient. Patient committed to quitting alcohol use on discharge.  AKI No available BMP to compare baseline renal function. Creatinine of 1.65 on admission and improved to 1.35 prior to discharge. Possibly has some CKD but cannot confirm. Outpatient PCP follow-up.  UTI Klebsiella oxytoca isolated in urine culture. Treated with three days of Ceftriaxone and discharged on Cefdinir to complete a total of 7 days.  Positive blood culture 1/2 sets positive for gram positive bacteria which are most likely contaminants. Patient without evidence of serious bacterial infection.  History of PAD Stable. Continue aspirin.  Tobacco use Counseled while inpatient.  Discharge Diagnoses:  Principal Problem:   Seizure [facility]) Active Problems:   AKI (acute kidney injury) ([place])   Alcohol use   Complicated UTI (urinary tract infection)    Discharge Instructions  Discharge Instructions    Ambulatory referral to Neurology   Complete by: As directed    An appointment is requested in approximately: 1 week   Diet - low sodium heart healthy   Complete by: As directed    Increase activity slowly   Complete by: As directed      Allergies as of 02/13/2020   Not on File     Medication List    STOP taking these medications   ibuprofen 200 MG tablet Commonly known as: ADVIL  TAKE these medications   aspirin EC 81 MG tablet Take 81 mg by mouth daily. Swallow whole.   cefdinir 300 MG capsule Commonly known as: OMNICEF Take 1 capsule (300 mg total) by mouth 2 (two) times daily for 4 days. Start taking on: February 13, 9517   folic acid 1 MG tablet Commonly known as: FOLVITE Take 1 tablet (1 mg total)  by mouth daily.   levETIRAcetam 500 MG tablet Commonly known as: KEPPRA Take 1 tablet (500 mg total) by mouth 2 (two) times daily.   multivitamin with minerals Tabs tablet Take 1 tablet by mouth daily.   thiamine 100 MG tablet Take 1 tablet (100 mg total) by mouth daily. Start taking on: February 14, 2020       Follow-up Information    [facility]. Call in 2 days.   Specialty: Neurology Contact information: [address] [place] [id number]       Jefm Petty, MD Follow up.   Specialty: Family Medicine Why: Hospital follow-up Contact information: [address] [place] [address] [phone]              Not on File  Consultations:  Neurology   Procedures/Studies: EEG  Result Date: 02/11/2020 Lora Havens, MD     02/11/2020 11:27 PM Patient Name: Robert Blackwell MRN: [id number] Epilepsy Attending: Lora Havens Referring Physician/Provider: Dr Gean Birchwood Date: 02/11/2020 Duration: 24.39 mins Patient history: : 79 year old male with new onset seizures, now with two separate seizures. EEG to evaluate for seizure. Level of alertness: Awake,  Asleep AEDs during EEG study: LEV Technical aspects: This EEG study was done with scalp electrodes positioned according to the 10-20 International system of electrode placement. Electrical activity was acquired at a sampling rate of 500Hz  and reviewed with a high frequency filter of 70Hz  and a low frequency filter of 1Hz . EEG data were recorded continuously and digitally stored. Description: The posterior dominant rhythm consists of 8-9Hz  activity of moderate voltage (25-35 uV) seen predominantly in posterior head regions, symmetric and reactive to eye opening and eye closing. Sleep was characterized by vertex waves, sleep spindles (12 to 14 Hz), maximal frontocentral region. Hyperventilation and photic stimulation were not performed.   IMPRESSION:  This study is within normal limits. No seizures or epileptiform discharges were seen throughout the recording. Lora Havens   MR BRAIN WO CONTRAST  Result Date: 02/11/2020 CLINICAL DATA:  Seizure with abnormal neuro exam EXAM: MRI HEAD WITHOUT CONTRAST TECHNIQUE: Multiplanar, multiecho pulse sequences of the brain and surrounding structures were obtained without intravenous contrast. COMPARISON:  Head CT from yesterday FINDINGS: Brain: No acute infarction, hemorrhage, hydrocephalus, extra-axial collection or mass lesion. Cerebral volume loss which is mild for age. Chronic small vessel ischemia that is confluent in the biparietal white matter. No focal cortical finding to explain seizure. Unremarkable appearance of the bilateral hippocampus. Small remote bilateral cerebellar infarcts. Vascular: Preserved flow voids. Small basilar likely reflecting bilateral fetal type PCA. Skull and upper cervical spine: No evidence of marrow lesion. Sinuses/Orbits: Mild patchy mucosal thickening in the paranasal sinuses. IMPRESSION: 1. No acute or reversible finding. No focal cortical finding to explain seizure. 2. 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 cerebellar infarcts. Electronically Signed   By: Monte Fantasia M.D.   On: 02/11/2020 07:53   DG CHEST PORT 1 VIEW  Result Date: 02/11/2020 CLINICAL DATA:  Systemic inflammatory response syndrome. EXAM: PORTABLE CHEST 1 VIEW COMPARISON:  None available. FINDINGS: 1215 hours. Two views are submitted. The heart is mildly enlarged. There is aortic atherosclerosis. There is vascular congestion without overt pulmonary edema, confluent airspace opacity or pneumothorax. Mild blunting of both costophrenic angles is present without large pleural effusion. The bones appear unremarkable. Telemetry leads overlie the chest. IMPRESSION: Cardiomegaly with vascular congestion and possible small pleural effusions. No overt pulmonary edema. Electronically Signed    By: Richardean Sale M.D.   On: 02/11/2020 12:25   ECHOCARDIOGRAM COMPLETE  Result Date: 02/12/2020    ECHOCARDIOGRAM REPORT   Patient Name:   Robert Blackwell [facility] Date of Exam: 02/12/2020 Medical Rec #:  [id number]   Height:       70.0 in Accession #:    [id number]  Weight:       164.7 lb Date of Birth:  09/07/1940   BSA:          1.922 m Patient Age:    37 years    BP:           106/59 mmHg Patient Gender: M           HR:           52 bpm. 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  Sonographer:    Mikki Santee RDCS (AE) Referring Phys: [place]  1. Left ventricular ejection fraction, by estimation, is 55 to 60%. The left ventricle has normal function. The left ventricle has no regional wall motion abnormalities. There is mild left ventricular hypertrophy. Left ventricular diastolic parameters were normal.  2. Right ventricular systolic function is normal. The right ventricular size is mildly enlarged. There is normal pulmonary artery systolic pressure. The estimated right ventricular systolic pressure is 65.0 mmHg.  3. Left atrial size was mildly dilated.  4. The mitral valve is normal in structure. Trivial mitral valve regurgitation.  5. The aortic valve was not well visualized. Aortic valve regurgitation is not visualized. No aortic stenosis is present.  6. The inferior vena cava is dilated in size with >50% respiratory variability, suggesting right atrial pressure of 8 mmHg. FINDINGS  Left Ventricle: Left ventricular ejection fraction, by estimation, is 55 to 60%. The left ventricle has normal function. The left ventricle has no regional wall motion abnormalities. The left ventricular internal cavity size was normal in size. There is  mild left ventricular hypertrophy. Left ventricular diastolic parameters were normal. Right Ventricle: The right ventricular size is  mildly enlarged. No increase in right ventricular wall thickness. Right ventricular systolic function is normal. There is normal pulmonary artery systolic pressure. The tricuspid regurgitant velocity is 2.16  m/s, and with an assumed right atrial pressure of 8 mmHg, the estimated right ventricular systolic pressure is 35.4 mmHg. Left Atrium: Left atrial size was mildly dilated. Right Atrium: Right atrial size was normal in size. Pericardium: There is no evidence of pericardial effusion. Mitral Valve: The mitral valve is normal in structure. Trivial mitral valve regurgitation. Tricuspid Valve: The tricuspid valve is normal in structure. Tricuspid valve regurgitation is trivial. Aortic Valve: The aortic valve was not well visualized. Aortic valve regurgitation is not visualized. No aortic stenosis is present. Pulmonic Valve: The pulmonic valve was not well visualized. Pulmonic valve regurgitation is not visualized. Aorta: The aortic root and ascending aorta are structurally normal, with no evidence of dilitation. Venous: The  inferior vena cava is dilated in size with greater than 50% respiratory variability, suggesting right atrial pressure of 8 mmHg. IAS/Shunts: The interatrial septum was not well visualized.  LEFT VENTRICLE PLAX 2D LVIDd:         4.40 cm  Diastology LVIDs:         3.20 cm  LV e' lateral:   12.00 cm/s LV PW:         1.10 cm  LV E/e' lateral: 8.4 LV IVS:        1.20 cm  LV e' medial:    7.72 cm/s LVOT diam:     2.30 cm  LV E/e' medial:  13.1 LV SV:         102 LV SV Index:   53 LVOT Area:     4.15 cm  RIGHT VENTRICLE TAPSE (M-mode): 2.0 cm LEFT ATRIUM             Index       RIGHT ATRIUM           Index LA diam:        4.10 cm 2.13 cm/m  RA Area:     18.00 cm LA Vol (A2C):   79.7 ml 41.47 ml/m RA Volume:   48.40 ml  25.18 ml/m LA Vol (A4C):   61.4 ml 31.95 ml/m LA Biplane Vol: 73.5 ml 38.24 ml/m  AORTIC VALVE LVOT Vmax:   91.40 cm/s LVOT Vmean:  60.500 cm/s LVOT VTI:    0.246 m  AORTA Ao Root  diam: 3.40 cm MITRAL VALVE                TRICUSPID VALVE MV Area (PHT): 2.83 cm     TR Peak grad:   18.7 mmHg MV Decel Time: 268 msec     TR Vmax:        216.00 cm/s MV E velocity: 101.00 cm/s MV A velocity: 58.80 cm/s   SHUNTS MV E/A ratio:  1.72         Systemic VTI:  0.25 m                             Systemic Diam: 2.30 cm Oswaldo Milian MD Electronically signed by Oswaldo Milian MD Signature Date/Time: 02/12/2020/2:04:37 PM    Final    CT HEAD CODE STROKE WO CONTRAST  Result Date: 02/10/2020 CLINICAL DATA:  Code stroke. Neuro deficit, acute, stroke suspected. EXAM: CT HEAD WITHOUT CONTRAST TECHNIQUE: Contiguous axial images were obtained from the base of the skull through the vertex without intravenous contrast. COMPARISON:  None. FINDINGS: Brain: No acute cortically based infarct, intracranial hemorrhage, mass, midline shift, or extra-axial fluid collection is identified. Mild cerebral atrophy is within normal limits for age. Patchy hypodensities in the cerebral white matter bilaterally are nonspecific but compatible with mild-to-moderate chronic small vessel ischemic disease. There are one or two suspected chronic lacunar infarcts along the left caudate body and periventricular white matter. A subcentimeter left cerebellar infarct also is chronic in appearance. Vascular: Calcified atherosclerosis at the skull base. No hyperdense vessel. Skull: No fracture or suspicious osseous lesion. Sinuses/Orbits: Minimal mucosal thickening in the ethmoid sinuses. Clear mastoid air cells. Unremarkable orbits. Other: None. ASPECTS [facility] Stroke Program Early CT Score) - Ganglionic level infarction (caudate, lentiform nuclei, internal capsule, insula, M1-M3 cortex): 7 - Supraganglionic infarction (M4-M6 cortex): 3 Total score (0-10 with 10 being normal): 10 IMPRESSION: 1. No evidence of acute intracranial abnormality.  2. ASPECTS is 10. 3. Mild-to-moderate chronic small vessel ischemic disease. These results  were communicated to Dr. Leonel Ramsay at 8:59 pm on 02/10/2020 by text page via the [facility] messaging system. Electronically Signed   By: Logan Bores M.D.   On: 02/10/2020 21:00    Subjective: No concerns  Discharge Exam: Vitals:   02/13/20 0804 02/13/20 1301  BP: 120/61 (!) 130/57  Pulse: (!) 59 61  Resp: 18 18  Temp: 97.6 F (36.4 C) 97.9 F (36.6 C)  SpO2: 95% 98%   Vitals:   02/12/20 2321 02/13/20 0326 02/13/20 0804 02/13/20 1301  BP: 125/62 133/63 120/61 (!) 130/57  Pulse: 64 89 (!) 59 61  Resp: 18 19 18 18   Temp: 97.8 F (36.6 C) 97.6 F (36.4 C) 97.6 F (36.4 C) 97.9 F (36.6 C)  TempSrc: Oral Oral Oral Oral  SpO2: 92% 90% 95% 98%  Weight:      Height:        General: Pt is alert, awake, not in acute distress Cardiovascular: RRR, S1/S2 +, no rubs, no gallops Respiratory: CTA bilaterally, no wheezing, no rhonchi Abdominal: Soft, NT, ND, bowel sounds + Extremities: no edema, no cyanosis    The results of significant diagnostics from this hospitalization (including imaging, microbiology, ancillary and laboratory) are listed below for reference.     Microbiology: Recent Results (from the past 240 hour(s))  SARS Coronavirus 2 by RT PCR (hospital order, performed in [facility] hospital lab) Nasopharyngeal Nasopharyngeal Swab     Status: None   Collection Time: 02/10/20 11:16 PM   Specimen: Nasopharyngeal Swab  Result Value Ref Range Status   SARS Coronavirus 2 NEGATIVE NEGATIVE Final    Comment: (NOTE) SARS-CoV-2 target nucleic acids are NOT DETECTED.  The SARS-CoV-2 RNA is generally detectable in upper and lower respiratory specimens during the acute phase of infection. The lowest concentration of SARS-CoV-2 viral copies this assay can detect is 250 copies / mL. A negative result does not preclude SARS-CoV-2 infection and should not be used as the sole basis for treatment or other patient management decisions.  A negative result may occur with improper  specimen collection / handling, submission of specimen other than nasopharyngeal swab, presence of viral mutation(s) within the areas targeted by this assay, and inadequate number of viral copies (<250 copies / mL). A negative result must be combined with clinical observations, patient history, and epidemiological information.  Fact Sheet for Patients:   StrictlyIdeas.no  Fact Sheet for Healthcare Providers: BankingDealers.co.za  This test is not yet approved or  cleared by the [place] FDA and has been authorized for detection and/or diagnosis of SARS-CoV-2 by FDA under an Emergency Use Authorization (EUA).  This EUA will remain in effect (meaning this test can be used) for the duration of the COVID-19 declaration under Section 564(b)(1) of the Act, 21 U.S.C. section 360bbb-3(b)(1), unless the authorization is terminated or revoked sooner.  Performed at [facility], [place] [address], [place], [place] [address]   Culture, blood (routine x 2)     Status: None (Preliminary result)   Collection Time: 02/11/20  5:04 AM   Specimen: BLOOD LEFT FOREARM  Result Value Ref Range Status   Specimen Description BLOOD LEFT FOREARM  Final   Special Requests   Final    BOTTLES DRAWN AEROBIC AND ANAEROBIC Blood Culture adequate volume   Culture   Final    NO GROWTH 2 DAYS Performed at [facility], [place] [address],  [place], [place] [address]    Report Status PENDING  Incomplete  Culture, blood (routine x 2)     Status: Abnormal   Collection Time: 02/11/20  5:05 AM   Specimen: BLOOD  Result Value Ref Range Status   Specimen Description BLOOD LEFT ANTECUBITAL  Final   Special Requests   Final    BOTTLES DRAWN AEROBIC AND ANAEROBIC Blood Culture adequate volume   Culture  Setup Time (A)  Final    GRAM VARIABLE ROD GRAM POSITIVE COCCI IN CLUSTERS ANAEROBIC BOTTLE ONLY CRITICAL RESULT CALLED TO, READ BACK BY AND VERIFIED WITH: R  RUMBARGER,PHARMD AT 2101 02/11/20 BY L BENFIELD    Culture (A)  Final    STAPHYLOCOCCUS HAEMOLYTICUS STAPHYLOCOCCUS HOMINIS THE SIGNIFICANCE OF ISOLATING THIS ORGANISM FROM A SINGLE SET OF BLOOD CULTURES WHEN MULTIPLE SETS ARE DRAWN IS UNCERTAIN. PLEASE NOTIFY THE MICROBIOLOGY DEPARTMENT WITHIN ONE WEEK IF SPECIATION AND SENSITIVITIES ARE REQUIRED. BACILLUS SPECIES Standardized susceptibility testing for this organism is not available. Performed at [facility], [place] [address], [place], [place] [address]    Report Status 02/13/2020 FINAL  Final  Blood Culture ID Panel (Reflexed)     Status: Abnormal   Collection Time: 02/11/20  5:05 AM  Result Value Ref Range Status   Enterococcus faecalis NOT DETECTED NOT DETECTED Final   Enterococcus Faecium NOT DETECTED NOT DETECTED Final   Listeria monocytogenes NOT DETECTED NOT DETECTED Final   Staphylococcus species DETECTED (A) NOT DETECTED Final    Comment: CRITICAL RESULT CALLED TO, READ BACK BY AND VERIFIED WITH: R RUMBARGER,PHARMD AT 2101 02/11/20 BY L BENFIELD    Staphylococcus aureus (BCID) NOT DETECTED NOT DETECTED Final   Staphylococcus epidermidis DETECTED (A) NOT DETECTED Final    Comment: Methicillin (oxacillin) resistant coagulase negative staphylococcus. Possible blood culture contaminant (unless isolated from more than one blood culture draw or clinical case suggests pathogenicity). No antibiotic treatment is indicated for blood  culture contaminants. CRITICAL RESULT CALLED TO, READ BACK BY AND VERIFIED WITH: R RUMBARGER,PHARMD AT 2101 02/11/20 BY L BENFIELD    Staphylococcus lugdunensis NOT DETECTED NOT DETECTED Final   Streptococcus species NOT DETECTED NOT DETECTED Final   Streptococcus agalactiae NOT DETECTED NOT DETECTED Final   Streptococcus pneumoniae NOT DETECTED NOT DETECTED Final   Streptococcus pyogenes NOT DETECTED NOT DETECTED Final   A.calcoaceticus-baumannii NOT DETECTED NOT DETECTED Final   Bacteroides fragilis  NOT DETECTED NOT DETECTED Final   Enterobacterales NOT DETECTED NOT DETECTED Final   Enterobacter cloacae complex NOT DETECTED NOT DETECTED Final   Escherichia coli NOT DETECTED NOT DETECTED Final   Klebsiella aerogenes NOT DETECTED NOT DETECTED Final   Klebsiella oxytoca NOT DETECTED NOT DETECTED Final   Klebsiella pneumoniae NOT DETECTED NOT DETECTED Final   Proteus species NOT DETECTED NOT DETECTED Final   Salmonella species NOT DETECTED NOT DETECTED Final   Serratia marcescens NOT DETECTED NOT DETECTED Final   Haemophilus influenzae NOT DETECTED NOT DETECTED Final   Neisseria meningitidis NOT DETECTED NOT DETECTED Final   Pseudomonas aeruginosa NOT DETECTED NOT DETECTED Final   Stenotrophomonas maltophilia NOT DETECTED NOT DETECTED Final   Candida albicans NOT DETECTED NOT DETECTED Final   Candida auris NOT DETECTED NOT DETECTED Final   Candida glabrata NOT DETECTED NOT DETECTED Final   Candida krusei NOT DETECTED NOT DETECTED Final   Candida parapsilosis NOT DETECTED NOT DETECTED Final   Candida tropicalis NOT DETECTED NOT DETECTED Final   Cryptococcus neoformans/gattii NOT DETECTED NOT DETECTED Final   Methicillin resistance  mecA/C DETECTED (A) NOT DETECTED Final    Comment: CRITICAL RESULT CALLED TO, READ BACK BY AND VERIFIED WITH: R RUMBARGER,PHARMD AT 2101 02/11/20 BY L BENFIELD Performed at [facility], [place] [address] [place], [place] [address]   Culture, Urine     Status: Abnormal   Collection Time: 02/11/20  4:55 PM   Specimen: Urine, Random  Result Value Ref Range Status   Specimen Description URINE, RANDOM  Final   Special Requests   Final    NONE Performed at [facility], [place] [address], [place], [place] [address]    Culture >=100,000 COLONIES/mL KLEBSIELLA OXYTOCA (A)  Final   Report Status 02/13/2020 FINAL  Final   Organism ID, Bacteria KLEBSIELLA OXYTOCA (A)  Final      Susceptibility   Klebsiella oxytoca - MIC*    AMPICILLIN >=32 RESISTANT  Resistant     CEFAZOLIN 8 SENSITIVE Sensitive     CEFTRIAXONE <=0.25 SENSITIVE Sensitive     CIPROFLOXACIN <=0.25 SENSITIVE Sensitive     GENTAMICIN <=1 SENSITIVE Sensitive     IMIPENEM <=0.25 SENSITIVE Sensitive     NITROFURANTOIN <=16 SENSITIVE Sensitive     TRIMETH/SULFA <=20 SENSITIVE Sensitive     AMPICILLIN/SULBACTAM 16 INTERMEDIATE Intermediate     PIP/TAZO <=4 SENSITIVE Sensitive     * >=100,000 COLONIES/mL KLEBSIELLA OXYTOCA     Labs: BNP (last 3 results) No results for input(s): BNP in the last 8760 hours. Basic Metabolic Panel: Recent Labs  Lab 02/10/20 2043 02/10/20 2044 02/11/20 0109 02/11/20 0502 02/12/20 1442  NA 135 136 137  --  138  K 3.5 3.7 3.9  --  3.9  CL 101 103 107  --  106  CO2 15*  --  20*  --  21*  GLUCOSE 122* 115* 132*  --  109*  BUN 13 13 11   --  8  CREATININE 1.65* 1.40* 1.44* 1.40* 1.35*  CALCIUM 8.9  --  8.5*  --  8.6*  MG  --   --   --   --  1.8  PHOS  --   --   --   --  3.2   Liver Function Tests: Recent Labs  Lab 02/10/20 2043 02/11/20 0502 02/12/20 1442  AST 32 24  --   ALT 25 23  --   ALKPHOS 84 68  --   BILITOT 0.7 0.7  --   PROT 7.3 5.9*  --   ALBUMIN 3.2* 2.7* 2.6*   No results for input(s): LIPASE, AMYLASE in the last 168 hours. No results for input(s): AMMONIA in the last 168 hours. CBC: Recent Labs  Lab 02/10/20 2043 02/10/20 2044 02/11/20 0109 02/11/20 0502 02/12/20 1442  WBC 8.9  --  7.4 6.9 5.5  NEUTROABS 5.2  --   --   --  3.5  HGB 13.0 12.6* 11.4* 10.9* 11.8*  HCT 39.8 37.0* 34.6* 33.7* 35.9*  MCV 99.5  --  97.7 97.7 96.8  PLT 256  --  168 192 172   Cardiac Enzymes: No results for input(s): CKTOTAL, CKMB, CKMBINDEX, TROPONINI in the last 168 hours. BNP: Invalid input(s): POCBNP CBG: Recent Labs  Lab 02/10/20 2038 02/10/20 2258  GLUCAP 121* 137*   D-Dimer No results for input(s): DDIMER in the last 72 hours. Hgb A1c No results for input(s): HGBA1C in the last 72 hours. Lipid Profile No  results for input(s): CHOL, HDL, LDLCALC, TRIG, CHOLHDL, LDLDIRECT in the last 72 hours. Thyroid function studies No  results for input(s): TSH, T4TOTAL, T3FREE, THYROIDAB in the last 72 hours.  Invalid input(s): FREET3 Anemia work up No results for input(s): VITAMINB12, FOLATE, FERRITIN, TIBC, IRON, RETICCTPCT in the last 72 hours. Urinalysis    Component Value Date/Time   COLORURINE YELLOW 02/11/2020 0611   APPEARANCEUR CLEAR 02/11/2020 0611   LABSPEC 1.014 02/11/2020 0611   PHURINE 5.0 02/11/2020 0611   GLUCOSEU NEGATIVE 02/11/2020 0611   HGBUR SMALL (A) 02/11/2020 0611   BILIRUBINUR NEGATIVE 02/11/2020 0611   KETONESUR NEGATIVE 02/11/2020 0611   PROTEINUR NEGATIVE 02/11/2020 0611   NITRITE POSITIVE (A) 02/11/2020 0611   LEUKOCYTESUR MODERATE (A) 02/11/2020 0611   Sepsis Labs Invalid input(s): PROCALCITONIN,  WBC,  LACTICIDVEN Microbiology Recent Results (from the past 240 hour(s))  SARS Coronavirus 2 by RT PCR (hospital order, performed in [place] hospital lab) Nasopharyngeal Nasopharyngeal Swab     Status: None   Collection Time: 02/10/20 11:16 PM   Specimen: Nasopharyngeal Swab  Result Value Ref Range Status   SARS Coronavirus 2 NEGATIVE NEGATIVE Final    Comment: (NOTE) SARS-CoV-2 target nucleic acids are NOT DETECTED.  The SARS-CoV-2 RNA is generally detectable in upper and lower respiratory specimens during the acute phase of infection. The lowest concentration of SARS-CoV-2 viral copies this assay can detect is 250 copies / mL. A negative result does not preclude SARS-CoV-2 infection and should not be used as the sole basis for treatment or other patient management decisions.  A negative result may occur with improper specimen collection / handling, submission of specimen other than nasopharyngeal swab, presence of viral mutation(s) within the areas targeted by this assay, and inadequate number of viral copies (<250 copies / mL). A negative result must be  combined with clinical observations, patient history, and epidemiological information.  Fact Sheet for Patients:   StrictlyIdeas.no  Fact Sheet for Healthcare Providers: BankingDealers.co.za  This test is not yet approved or  cleared by the [place] FDA and has been authorized for detection and/or diagnosis of SARS-CoV-2 by FDA under an Emergency Use Authorization (EUA).  This EUA will remain in effect (meaning this test can be used) for the duration of the COVID-19 declaration under Section 564(b)(1) of the Act, 21 U.S.C. section 360bbb-3(b)(1), unless the authorization is terminated or revoked sooner.  Performed at [facility], [place] [address] [place], [place] [address]   Culture, blood (routine x 2)     Status: None (Preliminary result)   Collection Time: 02/11/20  5:04 AM   Specimen: BLOOD LEFT FOREARM  Result Value Ref Range Status   Specimen Description BLOOD LEFT FOREARM  Final   Special Requests   Final    BOTTLES DRAWN AEROBIC AND ANAEROBIC Blood Culture adequate volume   Culture   Final    NO GROWTH 2 DAYS Performed at [facility], [place] [address] [place], [place] [address]    Report Status PENDING  Incomplete  Culture, blood (routine x 2)     Status: Abnormal   Collection Time: 02/11/20  5:05 AM   Specimen: BLOOD  Result Value Ref Range Status   Specimen Description BLOOD LEFT ANTECUBITAL  Final   Special Requests   Final    BOTTLES DRAWN AEROBIC AND ANAEROBIC Blood Culture adequate volume   Culture  Setup Time (A)  Final    GRAM VARIABLE ROD GRAM POSITIVE COCCI IN CLUSTERS ANAEROBIC BOTTLE ONLY CRITICAL RESULT CALLED TO, READ BACK BY AND VERIFIED WITH: R RUMBARGER,PHARMD AT 2101 02/11/20 BY L  BENFIELD    Culture (A)  Final    STAPHYLOCOCCUS HAEMOLYTICUS STAPHYLOCOCCUS HOMINIS THE SIGNIFICANCE OF ISOLATING THIS ORGANISM FROM A SINGLE SET OF BLOOD CULTURES WHEN MULTIPLE SETS ARE DRAWN IS  UNCERTAIN. PLEASE NOTIFY THE MICROBIOLOGY DEPARTMENT WITHIN ONE WEEK IF SPECIATION AND SENSITIVITIES ARE REQUIRED. BACILLUS SPECIES Standardized susceptibility testing for this organism is not available. Performed at [facility], [place] [address], [place], [place] [address]    Report Status 02/13/2020 FINAL  Final  Blood Culture ID Panel (Reflexed)     Status: Abnormal   Collection Time: 02/11/20  5:05 AM  Result Value Ref Range Status   Enterococcus faecalis NOT DETECTED NOT DETECTED Final   Enterococcus Faecium NOT DETECTED NOT DETECTED Final   Listeria monocytogenes NOT DETECTED NOT DETECTED Final   Staphylococcus species DETECTED (A) NOT DETECTED Final    Comment: CRITICAL RESULT CALLED TO, READ BACK BY AND VERIFIED WITH: R RUMBARGER,PHARMD AT 2101 02/11/20 BY L BENFIELD    Staphylococcus aureus (BCID) NOT DETECTED NOT DETECTED Final   Staphylococcus epidermidis DETECTED (A) NOT DETECTED Final    Comment: Methicillin (oxacillin) resistant coagulase negative staphylococcus. Possible blood culture contaminant (unless isolated from more than one blood culture draw or clinical case suggests pathogenicity). No antibiotic treatment is indicated for blood  culture contaminants. CRITICAL RESULT CALLED TO, READ BACK BY AND VERIFIED WITH: R RUMBARGER,PHARMD AT 2101 02/11/20 BY L BENFIELD    Staphylococcus lugdunensis NOT DETECTED NOT DETECTED Final   Streptococcus species NOT DETECTED NOT DETECTED Final   Streptococcus agalactiae NOT DETECTED NOT DETECTED Final   Streptococcus pneumoniae NOT DETECTED NOT DETECTED Final   Streptococcus pyogenes NOT DETECTED NOT DETECTED Final   A.calcoaceticus-baumannii NOT DETECTED NOT DETECTED Final   Bacteroides fragilis NOT DETECTED NOT DETECTED Final   Enterobacterales NOT DETECTED NOT DETECTED Final   Enterobacter cloacae complex NOT DETECTED NOT DETECTED Final   Escherichia coli NOT DETECTED NOT DETECTED Final   Klebsiella aerogenes NOT DETECTED NOT  DETECTED Final   Klebsiella oxytoca NOT DETECTED NOT DETECTED Final   Klebsiella pneumoniae NOT DETECTED NOT DETECTED Final   Proteus species NOT DETECTED NOT DETECTED Final   Salmonella species NOT DETECTED NOT DETECTED Final   Serratia marcescens NOT DETECTED NOT DETECTED Final   Haemophilus influenzae NOT DETECTED NOT DETECTED Final   Neisseria meningitidis NOT DETECTED NOT DETECTED Final   Pseudomonas aeruginosa NOT DETECTED NOT DETECTED Final   Stenotrophomonas maltophilia NOT DETECTED NOT DETECTED Final   Candida albicans NOT DETECTED NOT DETECTED Final   Candida auris NOT DETECTED NOT DETECTED Final   Candida glabrata NOT DETECTED NOT DETECTED Final   Candida krusei NOT DETECTED NOT DETECTED Final   Candida parapsilosis NOT DETECTED NOT DETECTED Final   Candida tropicalis NOT DETECTED NOT DETECTED Final   Cryptococcus neoformans/gattii NOT DETECTED NOT DETECTED Final   Methicillin resistance mecA/C DETECTED (A) NOT DETECTED Final    Comment: CRITICAL RESULT CALLED TO, READ BACK BY AND VERIFIED WITH: R RUMBARGER,PHARMD AT 2101 02/11/20 BY L BENFIELD Performed at [facility], [address], [place], [place] [address]   Culture, Urine     Status: Abnormal   Collection Time: 02/11/20  4:55 PM   Specimen: Urine, Random  Result Value Ref Range Status   Specimen Description URINE, RANDOM  Final   Special Requests   Final    NONE Performed at [facility], [place] [address], [place], [place] [address]    Culture >=100,000 COLONIES/mL KLEBSIELLA  OXYTOCA (A)  Final   Report Status 02/13/2020 FINAL  Final   Organism ID, Bacteria KLEBSIELLA OXYTOCA (A)  Final      Susceptibility   Klebsiella oxytoca - MIC*    AMPICILLIN >=32 RESISTANT Resistant     CEFAZOLIN 8 SENSITIVE Sensitive     CEFTRIAXONE <=0.25 SENSITIVE Sensitive     CIPROFLOXACIN <=0.25 SENSITIVE Sensitive     GENTAMICIN <=1 SENSITIVE Sensitive     IMIPENEM <=0.25 SENSITIVE Sensitive     NITROFURANTOIN <=16  SENSITIVE Sensitive     TRIMETH/SULFA <=20 SENSITIVE Sensitive     AMPICILLIN/SULBACTAM 16 INTERMEDIATE Intermediate     PIP/TAZO <=4 SENSITIVE Sensitive     * >=100,000 COLONIES/mL KLEBSIELLA OXYTOCA     Time coordinating discharge: 35 minutes  SIGNED:   Cordelia Poche, MD [facility] 02/13/2020, 1:02 PM

## 2020-02-16 ENCOUNTER — Other Ambulatory Visit: Payer: Self-pay

## 2020-02-16 ENCOUNTER — Ambulatory Visit: Payer: Medicare Other | Admitting: Neurology

## 2020-02-16 ENCOUNTER — Encounter: Payer: Self-pay | Admitting: Neurology

## 2020-02-16 VITALS — BP 124/64 | HR 60 | Ht 70.0 in | Wt 164.0 lb

## 2020-02-16 DIAGNOSIS — D649 Anemia, unspecified: Secondary | ICD-10-CM | POA: Insufficient documentation

## 2020-02-16 DIAGNOSIS — R569 Unspecified convulsions: Secondary | ICD-10-CM | POA: Diagnosis not present

## 2020-02-16 LAB — CULTURE, BLOOD (ROUTINE X 2)
Culture: NO GROWTH
Special Requests: ADEQUATE

## 2020-02-16 MED ORDER — LEVETIRACETAM 500 MG PO TABS: 500.0000 mg | ORAL_TABLET | Freq: Two times a day (BID) | ORAL | 4 refills | Status: DC

## 2020-02-16 NOTE — Progress Notes (Signed)
Chief Complaint  Patient presents with  . New Patient (Initial Visit)    He is here with his wife, Katharine Look, for hospital follow up. He had first time seizure at home on 02/10/20. He was transported to ED where a second seizure was witnessed. He was started on Keppra 500mg , one tablet twice daily. No further seizures since being on medication.  Marland Kitchen PCP    Burman Freestone, MD (referred from hospital)    HISTORICAL  JOSTEN WARMUTH is a 79 year old male, seen in request by his primary care physician Dr. Nance Pew, [facility] for evaluation of seizure, he is accompanied by his wife at today's clinical visit February 16, 2020  I reviewed and summarized the referring note.  Previously healthy, deny a history of seizure, head trauma, no family history of epilepsy,  On February 10, 2020, Friday evening, they have just finished dinner, sat down planning on drink some coffee, his wife noticed he had forceful leg extension, whole body tonic movement, bilateral arm drop to his chest, tonic-clonic shaking, foaming coming out of his mouth, eyes rolled back, episode last about couple minutes, followed by post event confusion, his wife called 911, by the time paramedic around in 10 to 15 minutes later, he began to came around, was confused, he could remember his right and evaluation at emergency room  Same day 11 PM, while at emergency room, he had another similar event,  Patient denies warning signs, denies tongue biting, had urinary incontinence during second episode  He was started on Keppra 500 mg twice daily, tolerating it okay  He retired, enjoying working the yard, keep his garden, he denies lateralized motor or sensory deficit I personally reviewed MRI of the brain without contrast on February 11, 2020, no acute abnormality, generalized atrophy, remote bilateral cerebellar infarction, moderate supratentorium small vessel disease  Laboratory evaluations in 2021, troponin was elevated 18, magnesium 1.8, CBC  showed anemia at 11.8,UDS was negative, however was less than 10  EEG was normal on Sept 4 2021  Echocardiogram September 2021, ejection fraction 55 to 60%, no regional wall motion abnormalities.   REVIEW OF SYSTEMS: Full 14 system review of systems performed and notable only for as above All other review of systems were negative.  ALLERGIES: No Known Allergies  HOME MEDICATIONS: Current Outpatient Medications  Medication Sig Dispense Refill  . aspirin EC 81 MG tablet Take 81 mg by mouth daily. Swallow whole.    . cefdinir (OMNICEF) 300 MG capsule Take 1 capsule (300 mg total) by mouth 2 (two) times daily for 4 days. 8 capsule 0  . folic acid (FOLVITE) 1 MG tablet Take 1 tablet (1 mg total) by mouth daily. 30 tablet 0  . levETIRAcetam (KEPPRA) 500 MG tablet Take 1 tablet (500 mg total) by mouth 2 (two) times daily. 60 tablet 0  . Multiple Vitamin (MULTIVITAMIN WITH MINERALS) TABS tablet Take 1 tablet by mouth daily.    Marland Kitchen thiamine 100 MG tablet Take 1 tablet (100 mg total) by mouth daily.     No current facility-administered medications for this visit.    PAST MEDICAL HISTORY: Past Medical History:  Diagnosis Date  . Seizure ([place])     PAST SURGICAL HISTORY: Past Surgical History:  Procedure Laterality Date  . VASCULAR SURGERY    . VEIN SURGERY      FAMILY HISTORY: Family History  Problem Relation Age of Onset  . Heart disease Mother   . Heart disease Father   . Seizures Neg  Hx     SOCIAL HISTORY: Social History   Socioeconomic History  . Marital status: Married    Spouse name: Not on file  . Number of children: 3  . Years of education: two years college  . Highest education level: Not on file  Occupational History  . Occupation: Retired  Tobacco Use  . Smoking status: Current Every Day Smoker    Packs/day: 0.75    Types: Cigarettes  . Smokeless tobacco: Never Used  Substance and Sexual Activity  . Alcohol use: Yes    Comment: 3 beers daily  . Drug use:  Never  . Sexual activity: Not on file  Other Topics Concern  . Not on file  Social History Narrative   Lives at home with his wife.   Right-handed.   4 cups caffeine per day.   Social Determinants of Health   Financial Resource Strain:   . Difficulty of Paying Living Expenses: Not on file  Food Insecurity:   . Worried About Charity fundraiser in the Last Year: Not on file  . Ran Out of Food in the Last Year: Not on file  Transportation Needs:   . Lack of Transportation (Medical): Not on file  . Lack of Transportation (Non-Medical): Not on file  Physical Activity:   . Days of Exercise per Week: Not on file  . Minutes of Exercise per Session: Not on file  Stress:   . Feeling of Stress : Not on file  Social Connections:   . Frequency of Communication with Friends and Family: Not on file  . Frequency of Social Gatherings with Friends and Family: Not on file  . Attends Religious Services: Not on file  . Active Member of Clubs or Organizations: Not on file  . Attends Archivist Meetings: Not on file  . Marital Status: Not on file  Intimate Partner Violence:   . Fear of Current or Ex-Partner: Not on file  . Emotionally Abused: Not on file  . Physically Abused: Not on file  . Sexually Abused: Not on file     PHYSICAL EXAM   Vitals:   02/16/20 1458  BP: 124/64  Pulse: 60  Weight: 164 lb (74.4 kg)  Height: 5\' 10"  (1.778 m)   Not recorded     Body mass index is 23.53 kg/m.  PHYSICAL EXAMNIATION:  Gen: NAD, conversant, well nourised, well groomed                     Cardiovascular: Regular rate rhythm, no peripheral edema, warm, nontender. Eyes: Conjunctivae clear without exudates or hemorrhage Neck: Supple, no carotid bruits. Pulmonary: Clear to auscultation bilaterally   NEUROLOGICAL EXAM:  MENTAL STATUS: Speech:    Speech is normal; fluent and spontaneous with normal comprehension.  Cognition:     Orientation to time, place and person     Normal  recent and remote memory     Normal Attention span and concentration     Normal Language, naming, repeating,spontaneous speech     Fund of knowledge   CRANIAL NERVES: CN II: Visual fields are full to confrontation. Pupils are round equal and briskly reactive to light. CN III, IV, VI: extraocular movement are normal. No ptosis. CN V: Facial sensation is intact to light touch CN VII: Face is symmetric with normal eye closure  CN VIII: Hearing is normal to causal conversation. CN IX, X: Phonation is normal. CN XI: Head turning and shoulder shrug are intact  MOTOR:  There is no pronator drift of out-stretched arms. Muscle bulk and tone are normal. Muscle strength is normal.  REFLEXES: Reflexes are 2+ and symmetric at the biceps, triceps, knees, and ankles. Plantar responses are flexor.  SENSORY: Intact to light touch, pinprick and vibratory sensation are intact in fingers and toes.  COORDINATION: There is no trunk or limb dysmetria noted.  GAIT/STANCE: Posture is normal. Gait is steady with normal steps, base, arm swing, and turning. Heel and toe walking are normal. Tandem gait is normal.  Romberg is absent.   DIAGNOSTIC DATA (LABS, IMAGING, TESTING) - I reviewed patient records, labs, notes, testing and imaging myself where available.   ASSESSMENT AND PLAN  JERIME ARIF is a 79 y.o. 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 Ph.D.  [facility] [address] [place], [place] [address] Ph: [phone] Fax: [phone]  CC:  Burman Freestone, MD [address] [place],  [place] [address]  Jefm Petty, MD

## 2020-02-17 LAB — IRON,TIBC AND FERRITIN PANEL
Ferritin: 378 ng/mL (ref 30–400)
Iron Saturation: 22 % (ref 15–55)
Iron: 70 ug/dL (ref 38–169)
Total Iron Binding Capacity: 315 ug/dL (ref 250–450)
UIBC: 245 ug/dL (ref 111–343)

## 2020-02-17 LAB — CK: Total CK: 30 U/L — ABNORMAL LOW (ref 41–331)

## 2020-02-17 LAB — THYROID PANEL WITH TSH
Free Thyroxine Index: 2.1 (ref 1.2–4.9)
T3 Uptake Ratio: 27 % (ref 24–39)
T4, Total: 7.6 ug/dL (ref 4.5–12.0)
TSH: 1.11 u[IU]/mL (ref 0.450–4.500)

## 2020-02-17 LAB — FOLATE: Folate: >20 ng/mL (ref >3.0)

## 2020-02-17 LAB — VITAMIN B12: Vitamin B-12: 408 pg/mL (ref 232–1245)

## 2020-02-20 ENCOUNTER — Telehealth: Payer: Self-pay | Admitting: *Deleted

## 2020-02-20 NOTE — Telephone Encounter (Signed)
I left a message with the results below on his voicemail. I provided our number to call back with any questions.

## 2020-02-20 NOTE — Telephone Encounter (Signed)
I spoke to patient's wife and she verbalized understanding of his lab findings.

## 2020-02-20 NOTE — Telephone Encounter (Signed)
-----   Message from Marcial Pacas, MD sent at 02/20/2020  9:05 AM EDT ----- Please call patient for no significant abnormality on laboratory result

## 2020-02-20 NOTE — Telephone Encounter (Signed)
Pt's wife, Travas Schexnayder returned phone call about Labs results. Ms. Lewan ask that you call her after 3:30p.

## 2020-03-01 ENCOUNTER — Telehealth: Payer: Self-pay | Admitting: Neurology

## 2020-03-01 ENCOUNTER — Other Ambulatory Visit: Payer: Self-pay | Admitting: *Deleted

## 2020-03-01 ENCOUNTER — Other Ambulatory Visit: Payer: Self-pay

## 2020-03-01 ENCOUNTER — Ambulatory Visit (HOSPITAL_COMMUNITY)
Admission: RE | Admit: 2020-03-01 | Discharge: 2020-03-01 | Disposition: A | Discharge status: Home / Self Care | Payer: Medicare Other | Source: Ambulatory Visit | Attending: Neurology | Admitting: Neurology

## 2020-03-01 ENCOUNTER — Encounter (HOSPITAL_COMMUNITY): Payer: Medicare Other

## 2020-03-01 DIAGNOSIS — I6523 Occlusion and stenosis of bilateral carotid arteries: Secondary | ICD-10-CM

## 2020-03-01 DIAGNOSIS — D649 Anemia, unspecified: Secondary | ICD-10-CM | POA: Diagnosis not present

## 2020-03-01 DIAGNOSIS — R569 Unspecified convulsions: Secondary | ICD-10-CM | POA: Diagnosis not present

## 2020-03-01 DIAGNOSIS — I639 Cerebral infarction, unspecified: Secondary | ICD-10-CM | POA: Insufficient documentation

## 2020-03-01 MED ORDER — FOLIC ACID 1 MG PO TABS: 1.0000 mg | ORAL_TABLET | Freq: Every day | ORAL | 0 refills | Status: AC

## 2020-03-01 NOTE — Telephone Encounter (Signed)
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  Left Carotid: Velocities in the left ICA are consistent with a 60-79% stenosis.  Vertebrals: Bilateral vertebral arteries demonstrate antegrade flow.  *See table(s) above for measurements and observations.   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

## 2020-03-01 NOTE — Progress Notes (Signed)
Carotid study completed.   See CVProc for preliminary results.   Kharter Brew, RDMS, RVT 

## 2020-03-01 NOTE — Telephone Encounter (Signed)
I spoke to the patient's wife on DPR. She verbalized understanding of the results, along with the need to continue his daily aspirin and to have a repeat test in March 2022.   Reports the patient has an appt with a new PCP on 03/31/20. She is asking for a refill of his folic acid so that he does not run out of the medication before being seen. She needs a 90-day supply to get the best price with their insurance plan. Per vo by Dr. Krista Blue, she is okay to provide this refill.

## 2020-03-05 ENCOUNTER — Other Ambulatory Visit: Payer: Medicare Other

## 2020-03-05 ENCOUNTER — Telehealth: Payer: Self-pay | Admitting: Neurology

## 2020-03-05 NOTE — Telephone Encounter (Signed)
  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  Left Carotid: Velocities in the left ICA are consistent with a 60-79% stenosis.  Vertebrals: Bilateral vertebral arteries demonstrate antegrade flow.  *See table(s) above for measurements and observations.

## 2020-03-15 ENCOUNTER — Ambulatory Visit: Payer: Medicare Other | Admitting: Neurology

## 2020-03-15 DIAGNOSIS — R569 Unspecified convulsions: Secondary | ICD-10-CM

## 2020-03-15 DIAGNOSIS — D649 Anemia, unspecified: Secondary | ICD-10-CM

## 2020-03-20 NOTE — Procedures (Signed)
   HISTORY: 80 year old male presented with seizure  TECHNIQUE:  This is a routine 16 channel EEG recording with one channel devoted to a limited EKG recording.  It was performed during wakefulness, drowsiness and asleep.  Hyperventilation and photic stimulation were performed as activating procedures.  There are minimum muscle and movement artifact noted.  Upon maximum arousal, posterior dominant waking rhythm consistent of rhythmic alpha range activity, with frequency of 9 hz. Activities are symmetric over the bilateral posterior derivations and attenuated with eye opening.  Hyperventilation produced mild/moderate buildup with higher amplitude and the slower activities noted.  Photic stimulation did not alter the tracing.  During EEG recording, patient developed drowsiness and no deeper stage of sleep was achieved  During EEG recording, there was no epileptiform discharge noted.  EKG demonstrate sinus rhythm, with heart rate of 56 bpm  CONCLUSION: This is a  normal awake EEG.  There is no electrodiagnostic evidence of epileptiform discharge.  Marcial Pacas, M.D. Ph.D.  [facility] [place], [place] [address] Phone: [phone] Fax:      [phone]

## 2020-03-28 ENCOUNTER — Other Ambulatory Visit: Payer: Self-pay

## 2020-03-28 ENCOUNTER — Encounter (HOSPITAL_COMMUNITY): Payer: Self-pay

## 2020-03-28 ENCOUNTER — Ambulatory Visit (HOSPITAL_COMMUNITY)
Admission: RE | Admit: 2020-03-28 | Discharge: 2020-03-28 | Disposition: A | Discharge status: Home / Self Care | Payer: Medicare Other | Source: Ambulatory Visit | Attending: Neurology | Admitting: Neurology

## 2020-03-28 DIAGNOSIS — I6523 Occlusion and stenosis of bilateral carotid arteries: Secondary | ICD-10-CM

## 2020-03-28 DIAGNOSIS — I639 Cerebral infarction, unspecified: Secondary | ICD-10-CM

## 2020-08-08 ENCOUNTER — Ambulatory Visit
Admission: RE | Admit: 2020-08-08 | Discharge: 2020-08-08 | Disposition: A | Discharge status: Home / Self Care | Payer: Medicare Other | Source: Ambulatory Visit | Attending: Neurology | Admitting: Neurology

## 2020-08-08 ENCOUNTER — Other Ambulatory Visit: Payer: Self-pay

## 2020-08-08 DIAGNOSIS — R569 Unspecified convulsions: Secondary | ICD-10-CM

## 2020-08-08 DIAGNOSIS — D649 Anemia, unspecified: Secondary | ICD-10-CM

## 2020-08-08 MED ORDER — GADOBENATE DIMEGLUMINE 529 MG/ML IV SOLN
15.0000 mL | Freq: Once | INTRAVENOUS | Status: AC | PRN
  Administered 2020-08-08: 15 mL via INTRAVENOUS

## 2020-08-13 ENCOUNTER — Telehealth: Payer: Self-pay | Admitting: Neurology

## 2020-08-13 NOTE — Telephone Encounter (Signed)
  IMPRESSION:   MRI brain with without contrast demonstrating: -Mild atrophy and mild chronic small vessel ischemic disease. -No acute findings.  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

## 2020-08-13 NOTE — Telephone Encounter (Signed)
I spoke to the patient's wife and she verbalized understanding of the MRI brain findings. She will relay this message to him. They will keep his follow up appt for further review.

## 2020-08-14 NOTE — Progress Notes (Signed)
Chief Complaint  Patient presents with   Follow-up    With wife, new rm, states he is doing well, would like to stop taking keppra     HISTORICAL  Robert Blackwell is a 80 year old male, seen in request by his primary care physician Dr. Nance Pew, [facility] for evaluation of seizure, he is accompanied by his wife at today's clinical visit February 16, 2020  I reviewed and summarized the referring note.  Previously healthy, deny a history of seizure, head trauma, no family history of epilepsy,  On February 10, 2020, Friday evening, they have just finished dinner, sat down planning on drink some coffee, his wife noticed he had forceful leg extension, whole body tonic movement, bilateral arm drop to his chest, tonic-clonic shaking, foaming coming out of his mouth, eyes rolled back, episode last about couple minutes, followed by post event confusion, his wife called 911, by the time paramedic around in 10 to 15 minutes later, he began to came around, was confused, he could remember his right and evaluation at emergency room  Same day 11 PM, while at emergency room, he had another similar event,  Patient denies warning signs, denies tongue biting, had urinary incontinence during second episode  He was started on Keppra 500 mg twice daily, tolerating it okay  He retired, enjoying working the yard, keep his garden, he denies lateralized motor or sensory deficit  I personally reviewed MRI of the brain without contrast on February 11, 2020, no acute abnormality, generalized atrophy, remote bilateral cerebellar infarction, moderate supratentorium small vessel disease  Laboratory evaluations in 2021, troponin was elevated 18, magnesium 1.8, CBC showed anemia at 11.8,UDS was negative, however was less than 10  EEG was normal on Sept 4 2021  Echocardiogram September 2021, ejection fraction 55 to 60%, no regional wall motion abnormalities.  Update August 14, 2020 SS: Here today with his wife.  Has been  doing well, no recurrent seizures.  Remains on Keppra 500 mg twice a day, aspirin 81 mg daily.  B12, folate, TSH, CK, Iron/TIBC/Ferritin were unremarkable.  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  Bilateral vertebral arteries demonstrate antegrade flow. Will recheck in 6 months.  Recommend continue aspirin daily.  EEG in October 2021 was normal.  MRI of the brain with and without contrast, March 2022 showed early changes, generalized atrophy, mild to moderate supratentorial small vessel disease  September 2021 MRI brain without contrast showed small bilateral remote cerebellar infarcts, moderate chronic small vessel ischemia.  REVIEW OF SYSTEMS: Full 14 system review of systems performed and notable only for as above All other review of systems were negative.  ALLERGIES: No Known Allergies  HOME MEDICATIONS: Current Outpatient Medications  Medication Sig Dispense Refill   aspirin EC 81 MG tablet Take 81 mg by mouth daily. Swallow whole.     Multiple Vitamin (MULTIVITAMIN WITH MINERALS) TABS tablet Take 1 tablet by mouth daily.     thiamine 100 MG tablet Take 1 tablet (100 mg total) by mouth daily.     levETIRAcetam (KEPPRA) 500 MG tablet Take 1 tablet (500 mg total) by mouth 2 (two) times daily. 180 tablet 4   No current facility-administered medications for this visit.    PAST MEDICAL HISTORY: Past Medical History:  Diagnosis Date   Seizure ([place])     PAST SURGICAL HISTORY: Past Surgical History:  Procedure Laterality Date   VASCULAR SURGERY     VEIN SURGERY  FAMILY HISTORY: Family History  Problem Relation Age of Onset   Heart disease Mother    Heart disease Father    Seizures Neg Hx     SOCIAL HISTORY: Social History   Socioeconomic History   Marital status: Married    Spouse name: Not on file   Number of children: 3   Years of education: two years college   Highest education level:  Not on file  Occupational History   Occupation: Retired  Tobacco Use   Smoking status: Current Every Day Smoker    Packs/day: 0.75    Types: Cigarettes   Smokeless tobacco: Never Used  Substance and Sexual Activity   Alcohol use: Yes    Comment: 3 beers daily   Drug use: Never   Sexual activity: Not on file  Other Topics Concern   Not on file  Social History Narrative   Lives at home with his wife.   Right-handed.   4 cups caffeine per day.   Social Determinants of Health   Financial Resource Strain: Not on file  Food Insecurity: Not on file  Transportation Needs: Not on file  Physical Activity: Not on file  Stress: Not on file  Social Connections: Not on file  Intimate Partner Violence: Not on file     PHYSICAL EXAM   Vitals:   08/15/20 1413  BP: (!) 156/63  Pulse: 62  Weight: 174 lb (78.9 kg)  Height: 5\' 11"  (1.803 m)   Not recorded     Body mass index is 24.27 kg/m.  PHYSICAL EXAMNIATION:  Gen: NAD, conversant, well nourised, well groomed                      NEUROLOGICAL EXAM:  MENTAL STATUS: Speech:    Speech is normal; fluent and spontaneous with normal comprehension.  Cognition:     Orientation to time, place and person     Normal recent and remote memory     Normal Attention span and concentration     Normal Language, naming, repeating,spontaneous speech     Fund of knowledge   CRANIAL NERVES: CN II: Visual fields are full to confrontation. Pupils are round equal and briskly reactive to light. CN III, IV, VI: extraocular movement are normal. No ptosis. CN V: Facial sensation is intact to light touch CN VII: Face is symmetric with normal eye closure  CN VIII: Hearing is normal to causal conversation. CN IX, X: Phonation is normal. CN XI: Head turning and shoulder shrug are intact  MOTOR: Good strength all extremities 5/5  REFLEXES: Reflexes are 2+ and symmetric at the biceps, triceps, knees, and ankles. Plantar responses are  flexor.  SENSORY: Intact to light touch to all extremities  COORDINATION: Finger-nose-finger and heel-to-shin are normal bilaterally  GAIT/STANCE: Gait is normal, steady, tandem gait slightly unsteady.   DIAGNOSTIC DATA (LABS, IMAGING, TESTING) - I reviewed patient records, labs, notes, testing and imaging myself where available.   ASSESSMENT AND PLAN  CLEVESTER HELZER is a 80 y.o. male   1. 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  Bilateral vertebral arteries demonstrate antegrade flow -B12, folate, TSH, CK, Iron/TIBC/Ferritin were unremarkable. -EEG in October 2021  was normal. -MRI of the brain with and without contrast March 2022 showed mild age-related changes, generalized atrophy, mild to moderate supratentorial small vessel disease -MRI of the brain without contrast in September 2021 showed generalized atrophy, remote bilateral cerebellar infarction, moderate supratentorial small vessel disease -Follow-up 4 months or sooner if needed, call for any seizure activity, will update patient based on results of US carotid  Orders Placed This Encounter  Procedures   VAS US CAROTID     I spent 30 minutes of face-to-face and non-face-to-face time with patient.  This included previsit chart review, lab review, study review, order entry, electronic health record documentation, patient education.  Evangeline Dakin, DNP  [facility] [address], [place], [place] [address] [phone]

## 2020-08-15 ENCOUNTER — Encounter: Payer: Self-pay | Admitting: Neurology

## 2020-08-15 ENCOUNTER — Ambulatory Visit: Payer: Medicare Other | Admitting: Neurology

## 2020-08-15 VITALS — BP 156/63 | HR 62 | Ht 71.0 in | Wt 174.0 lb

## 2020-08-15 DIAGNOSIS — I6523 Occlusion and stenosis of bilateral carotid arteries: Secondary | ICD-10-CM | POA: Diagnosis not present

## 2020-08-15 DIAGNOSIS — I639 Cerebral infarction, unspecified: Secondary | ICD-10-CM

## 2020-08-15 DIAGNOSIS — R569 Unspecified convulsions: Secondary | ICD-10-CM | POA: Diagnosis not present

## 2020-08-15 MED ORDER — LEVETIRACETAM 500 MG PO TABS: 500.0000 mg | ORAL_TABLET | Freq: Two times a day (BID) | ORAL | 4 refills | Status: AC

## 2020-08-15 NOTE — Patient Instructions (Signed)
Keep the Baker at current dosing Continue aspirin  Check US carotid arteries See you back in 4 months

## 2020-08-29 ENCOUNTER — Ambulatory Visit (HOSPITAL_COMMUNITY)
Admission: RE | Admit: 2020-08-29 | Discharge: 2020-08-29 | Disposition: A | Discharge status: Home / Self Care | Payer: Medicare Other | Source: Ambulatory Visit | Attending: Neurology | Admitting: Neurology

## 2020-08-29 ENCOUNTER — Other Ambulatory Visit: Payer: Self-pay

## 2020-08-29 ENCOUNTER — Telehealth: Payer: Self-pay | Admitting: Neurology

## 2020-08-29 DIAGNOSIS — I6523 Occlusion and stenosis of bilateral carotid arteries: Secondary | ICD-10-CM | POA: Diagnosis present

## 2020-08-29 NOTE — Telephone Encounter (Signed)
Keep current medications, aspirin 81 mg daily, advised patient increase water intake, continue yearly ultrasound carotid artery

## 2020-08-29 NOTE — Progress Notes (Signed)
Carotid US completed    Please see CV Proc for preliminary results.   Vonzell Schlatter, RVT

## 2020-08-29 NOTE — Telephone Encounter (Signed)
I called his wife, relayed findings, continue aspirin, check US Carotid annually, I will see again in July.

## 2020-08-29 NOTE — Telephone Encounter (Signed)
Recently saw the patient, ordered repeat US carotid 6 months from original. Comparison of the 2, most recent reports reduction in left carotids 1-39% stenosis vs previous 6 months ago, left was 60-79%. Right is consistently 60-79% stenosis. On aspirin 81 mg daily.  No seizures in September 2021.  Today 08/29/2020 SS:  Summary:  Right Carotid: Velocities in the right ICA are consistent with a 60-79%         stenosis. The ECA appears <50% stenosed.   Left Carotid: Velocities in the left ICA are consistent with a 1-39%  stenosis.        The ECA appears >50% stenosed. Velocities in left Bulb  indicate a        hemodynamically significant stenosis.   Vertebrals: Left vertebral artery demonstrates antegrade flow. Right is        severely dampened but remains antegrade.  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   03/01/2020: Summary:  Right Carotid: Velocities in the right ICA are consistent with a 60-79%         stenosis.   Left Carotid: Velocities in the left ICA are consistent with a 60-79%  stenosis.   Vertebrals: Bilateral vertebral arteries demonstrate antegrade flow.

## 2020-12-18 ENCOUNTER — Ambulatory Visit: Payer: Medicare Other | Admitting: Neurology

## 2020-12-18 ENCOUNTER — Encounter: Payer: Self-pay | Admitting: Neurology

## 2020-12-18 VITALS — BP 128/75 | HR 86 | Ht 71.0 in | Wt 174.0 lb

## 2020-12-18 DIAGNOSIS — R569 Unspecified convulsions: Secondary | ICD-10-CM

## 2020-12-18 DIAGNOSIS — I6523 Occlusion and stenosis of bilateral carotid arteries: Secondary | ICD-10-CM | POA: Diagnosis not present

## 2020-12-18 NOTE — Patient Instructions (Signed)
Continue current medications Stay on aspirin, drink plenty of water  Repeat carotid US around March 2023 See you back in 6-7 months

## 2020-12-18 NOTE — Progress Notes (Signed)
Chief Complaint  Patient presents with   Follow-up    Routine Visit Room 16, alone in room   HISTORICAL  Robert Blackwell is a 80 year old male, seen in request by his primary care physician Dr. Nance Pew, [facility] for evaluation of seizure, he is accompanied by his wife at today's clinical visit February 16, 2020  I reviewed and summarized the referring note.  Previously healthy, deny a history of seizure, head trauma, no family history of epilepsy,  On February 10, 2020, Friday evening, they have just finished dinner, sat down planning on drink some coffee, his wife noticed he had forceful leg extension, whole body tonic movement, bilateral arm drop to his chest, tonic-clonic shaking, foaming coming out of his mouth, eyes rolled back, episode last about couple minutes, followed by post event confusion, his wife called 911, by the time paramedic around in 10 to 15 minutes later, he began to came around, was confused, he could remember his right and evaluation at emergency room  Same day 11 PM, while at emergency room, he had another similar event,  Patient denies warning signs, denies tongue biting, had urinary incontinence during second episode  He was started on Keppra 500 mg twice daily, tolerating it okay  He retired, enjoying working the yard, keep his garden, he denies lateralized motor or sensory deficit  I personally reviewed MRI of the brain without contrast on February 11, 2020, no acute abnormality, generalized atrophy, remote bilateral cerebellar infarction, moderate supratentorium small vessel disease  Laboratory evaluations in 2021, troponin was elevated 18, magnesium 1.8, CBC showed anemia at 11.8,UDS was negative, however was less than 10  EEG was normal on Sept 4 2021  Echocardiogram September 2021, ejection fraction 55 to 60%, no regional wall motion abnormalities.  Update August 14, 2020 SS: Here today with his wife.  Has been doing well, no recurrent seizures.  Remains on  Keppra 500 mg twice a day, aspirin 81 mg daily.  B12, folate, TSH, CK, Iron/TIBC/Ferritin were unremarkable.  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  Bilateral vertebral arteries demonstrate antegrade flow. Will recheck in 6 months.  Recommend continue aspirin daily.  EEG in October 2021 was normal.  MRI of the brain with and without contrast, March 2022 showed mild age-related changes generalized atrophy, in addition, mild to moderate supratentorium small vessel disease.  September 2021 MRI brain without contrast showed small bilateral remote cerebellar infarcts, moderate chronic small vessel ischemia.  Update December 18, 2020 SS: Here today alone, doing well.  No recurrent seizures, remains on Keppra 500 mg twice a day, aspirin 81 mg daily.  He has no complaints today.  Remains active doing yard work and gardening.  Sees PCP every 6 months. He is driving.   Had repeat Carotid US in March 2022 comparison of previous in September 2021, most recent reports reduction in left carotids 1-39% stenosis vs previous 6 months ago, left was 60-79%. Right is consistently 60-79% stenosis. On aspirin 81 mg daily.  We will continue aspirin 81 mg daily, increase water intake, yearly carotid ultrasound  REVIEW OF SYSTEMS: Full 14 system review of systems performed and notable only for as above  See HPI  ALLERGIES: No Known Allergies  HOME MEDICATIONS: Current Outpatient Medications  Medication Sig Dispense Refill   aspirin EC 81 MG tablet Take 81 mg by mouth daily. Swallow whole.     levETIRAcetam (KEPPRA) 500 MG tablet Take 1 tablet (500 mg total) by  mouth 2 (two) times daily. 180 tablet 4   Multiple Vitamin (MULTIVITAMIN WITH MINERALS) TABS tablet Take 1 tablet by mouth daily.     thiamine 100 MG tablet Take 1 tablet (100 mg total) by mouth daily.     No current facility-administered medications for this visit.    PAST MEDICAL HISTORY: Past  Medical History:  Diagnosis Date   Seizure ([place])     PAST SURGICAL HISTORY: Past Surgical History:  Procedure Laterality Date   VASCULAR SURGERY     VEIN SURGERY      FAMILY HISTORY: Family History  Problem Relation Age of Onset   Heart disease Mother    Heart disease Father    Seizures Neg Hx     SOCIAL HISTORY: Social History   Socioeconomic History   Marital status: Married    Spouse name: Not on file   Number of children: 3   Years of education: two years college   Highest education level: Not on file  Occupational History   Occupation: Retired  Tobacco Use   Smoking status: Every Day    Packs/day: 0.75    Pack years: 0.00    Types: Cigarettes   Smokeless tobacco: Never  Substance and Sexual Activity   Alcohol use: Yes    Comment: 3 beers daily   Drug use: Never   Sexual activity: Not on file  Other Topics Concern   Not on file  Social History Narrative   Lives at home with his wife.   Right-handed.   4 cups caffeine per day.   Social Determinants of Health   Financial Resource Strain: Not on file  Food Insecurity: Not on file  Transportation Needs: Not on file  Physical Activity: Not on file  Stress: Not on file  Social Connections: Not on file  Intimate Partner Violence: Not on file   PHYSICAL EXAM   Vitals:   12/18/20 1000  BP: 128/75  Pulse: 86  Weight: 174 lb (78.9 kg)  Height: 5\' 11"  (1.803 m)    Not recorded    Body mass index is 24.27 kg/m. Physical Exam  General: The patient is alert and cooperative at the time of the examination.  Skin: No significant peripheral edema is noted.  Neurologic Exam  Mental status: The patient is alert and oriented x 3 at the time of the examination. The patient has apparent normal recent and remote memory, with an apparently normal attention span and concentration ability.  Cranial nerves: Facial symmetry is present. Speech is normal, no aphasia or dysarthria is noted. Extraocular movements  are full. Visual fields are full.  Motor: The patient has good strength in all 4 extremities.  Sensory examination: Soft touch sensation is symmetric on the face, arms, and legs.  Coordination: The patient has good finger-nose-finger and heel-to-shin bilaterally.  Gait and station: The patient has a normal gait.   Reflexes: Deep tendon reflexes are symmetric.  DIAGNOSTIC DATA (LABS, IMAGING, TESTING) - I reviewed patient records, labs, notes, testing and imaging myself where available.   ASSESSMENT AND PLAN  Robert Blackwell is a 80 y.o. male   1. 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 History of stroke by MRI, patient was not aware of any symptoms  -Mr. Domagala is doing well, no problems or concerns today -No recurrent seizure, since September 2021 -Continue Keppra 500 mg twice a day -Repeat Carotid US March 2022, reports reduction in left carotid stenosis 1-39% stenosis vs previous 6 months ago, left was  60-79%. 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  Bilateral vertebral arteries demonstrate antegrade flow -B12, folate, TSH, CK, Iron/TIBC/Ferritin were unremarkable. -EEG in October 2021 was normal. -MRI of the brain with and without contrast March 2022 showed mild age-related changes, generalized atrophy, mild to moderate supratentorial small vessel disease -MRI of the brain without contrast in September 2021 showed generalized atrophy, remote bilateral cerebellar infarction, moderate supratentorial small vessel disease -Call for any spells, follow-up in 6 to 7 months or sooner if needed, at that time will order carotid ultrasound  Evangeline Dakin, DNP  [facility] [address] [place], [place] [address] [phone]

## 2021-02-04 ENCOUNTER — Telehealth: Payer: Self-pay | Admitting: Neurology

## 2021-02-04 NOTE — Telephone Encounter (Signed)
I spoke to patient's wife. He has bladder surgery is schedule on 02/13/21. She is aware that he may continue levetiracetam as prescribed.   He is also still taking aspirin '81mg'$  daily. He has an appt with the surgeon on 02/06/21 to discuss his pre-surgical plan (holding meds, NPO, etc). If the surgeon has any questions, she will direct him to our office.

## 2021-02-04 NOTE — Telephone Encounter (Signed)
Pt's wife called stating her husband is scheduled for bladder surgery the week after labor day. She is wanting to know what he should do about his levETIRAcetam (KEPPRA) 500 MG tablet. Should he stop taking it altogether or just cut back. Pt's wife requesting a call back.

## 2021-03-07 ENCOUNTER — Other Ambulatory Visit: Payer: Self-pay

## 2021-03-07 ENCOUNTER — Inpatient Hospital Stay: Payer: Medicare Other | Attending: Oncology | Admitting: Oncology

## 2021-03-07 VITALS — BP 139/56 | HR 68 | Temp 97.8°F | Resp 17 | Ht 71.0 in | Wt 167.2 lb

## 2021-03-07 DIAGNOSIS — C679 Malignant neoplasm of bladder, unspecified: Secondary | ICD-10-CM | POA: Insufficient documentation

## 2021-03-07 MED ORDER — PROCHLORPERAZINE MALEATE 10 MG PO TABS: 10.0000 mg | ORAL_TABLET | Freq: Four times a day (QID) | ORAL | 0 refills | Status: AC | PRN

## 2021-03-07 MED ORDER — LIDOCAINE-PRILOCAINE 2.5-2.5 % EX CREA: 1.0000 "application " | TOPICAL_CREAM | CUTANEOUS | 0 refills | Status: AC | PRN

## 2021-03-07 NOTE — Progress Notes (Signed)
START ON PATHWAY REGIMEN - Bladder     A cycle is every 21 days:     Gemcitabine      Cisplatin   **Always confirm dose/schedule in your pharmacy ordering system**  Patient Characteristics: Pre-Cystectomy or Nonsurgical Candidate (Clinical Staging), cT2-4a, cN0-1, M0, Cystectomy Eligible, Cisplatin-Based Chemotherapy Indicated (CrCl ? 50 mL/min and Minimal or No Symptoms) Therapeutic Status: Pre-Cystectomy or Nonsurgical Candidate (Clinical Staging) AJCC M Category: cM0 AJCC 8 Stage Grouping: IIIA AJCC T Category: cT4a AJCC N Category: cN0 Intent of Therapy: Curative Intent, Discussed with Patient

## 2021-03-07 NOTE — Progress Notes (Signed)
Reason for the request:    Bladder cancer  HPI: I was asked by Dr. Harlow Asa to evaluate Robert Blackwell for the evaluation of bladder cancer.  This is an 80 year old man with history of chronic renal insufficiency and seizures who found to have hematuria and July 2022.  He was evaluated by his primary care provider and subsequently referred to Dr. Harlow Asa.  He underwent CT scan of the abdomen and pelvis completed on January 25, 2021 without contrast which showed a 4.2 cm soft tissue mass involving the urinary bladder and the distal right ureter at the UVJ.  The mass involving the right seminal vesicle and the pelvic sidewall.  He also had a 1.5 cm low-attenuation lesion in the anterior right hepatic lobe and an MRI was recommended for evaluation.  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  Laboratory data on February 13, 2021 showed normal CBC with a creatinine of 1.55 and a creatinine clearance of 45 cc/min.  He was evaluated by Dr. Harlow Asa at [facility] for recommendation regarding systemic chemotherapy.  Patient prefer to have that done locally for convenience.  Clinically, he reports feeling well without any major complaints.  He denies any nausea, vomiting or abdominal pain.  He denies any hematuria or dysuria.    He does not report any headaches, blurry vision, syncope or seizures. Does not report any fevers, chills or sweats.  Does not report any cough, wheezing or hemoptysis.  Does not report any chest pain, palpitation, orthopnea or leg edema.  Does not report any nausea, vomiting or abdominal pain.  Does not report any constipation or diarrhea.  Does not report any skeletal complaints.    Does not report frequency, urgency or hematuria.  Does not report any skin rashes or lesions. Does not report any heat or cold intolerance.  Does not report any lymphadenopathy or petechiae.  Does not  report any anxiety or depression.  Remaining review of systems is negative.     Past Medical History:  Diagnosis Date   Seizure [facility])   :   Past Surgical History:  Procedure Laterality Date   VASCULAR SURGERY     VEIN SURGERY    :   Current Outpatient Medications:    aspirin EC 81 MG tablet, Take 81 mg by mouth daily. Swallow whole., Disp: , Rfl:    levETIRAcetam (KEPPRA) 500 MG tablet, Take 1 tablet (500 mg total) by mouth 2 (two) times daily., Disp: 180 tablet, Rfl: 4   Multiple Vitamin (MULTIVITAMIN WITH MINERALS) TABS tablet, Take 1 tablet by mouth daily., Disp: , Rfl:    thiamine 100 MG tablet, Take 1 tablet (100 mg total) by mouth daily., Disp: , Rfl: :  No Known Allergies:   Family History  Problem Relation Age of Onset   Heart disease Mother    Heart disease Father    Seizures Neg Hx   :   Social History   Socioeconomic History   Marital status: Married    Spouse name: Not on file   Number of children: 3   Years of education: two years college   Highest education level: Not on file  Occupational History   Occupation: Retired  Tobacco Use   Smoking status: Every Day    Packs/day: 0.75    Types: Cigarettes   Smokeless tobacco: Never  Substance and Sexual Activity   Alcohol use: Yes  Comment: 3 beers daily   Drug use: Never   Sexual activity: Not on file  Other Topics Concern   Not on file  Social History Narrative   Lives at home with his wife.   Right-handed.   4 cups caffeine per day.   Social Determinants of Health   Financial Resource Strain: Not on file  Food Insecurity: Not on file  Transportation Needs: Not on file  Physical Activity: Not on file  Stress: Not on file  Social Connections: Not on file  Intimate Partner Violence: Not on file  :  Pertinent items are noted in HPI.  Exam: Blood pressure (!) 139/56, pulse 68, temperature 97.8 F (36.6 C), temperature source Oral, resp. rate 17, height 5\' 11"  (1.803 m), weight 167 lb  3.2 oz (75.8 kg), SpO2 98 %.  ECOG 1 General appearance: alert and cooperative appeared without distress. Head: atraumatic without any abnormalities. Eyes: conjunctivae/corneas clear. PERRL.  Sclera anicteric. Throat: lips, mucosa, and tongue normal; without oral thrush or ulcers. Resp: clear to auscultation bilaterally without rhonchi, wheezes or dullness to percussion. Cardio: regular rate and rhythm, S1, S2 normal, no murmur, click, rub or gallop GI: soft, non-tender; bowel sounds normal; no masses,  no organomegaly Skin: Skin color, texture, turgor normal. No rashes or lesions Lymph nodes: Cervical, supraclavicular, and axillary nodes normal. Neurologic: Grossly normal without any motor, sensory or deep tendon reflexes. Musculoskeletal: No joint deformity or effusion.    Assessment and Plan:    80 year old with:  1.  Bladder cancer confirmed in September 2022.  He was found to have a 4.2 x 3.8 cm mass involving the urinary bladder and right distal ureter with possible invasion into the right seminal vesicle and right pelvic sidewall.  He also has a 1.5 cm indeterminate lesion in the liver.  The natural course of this disease was reviewed at this time and treatment options were discussed.  Before proceeding with treatment completing the staging work-up he is recommended at this time.  Completing chest imaging as well as clarification of his hepatic lesion is mandatory before proceeding with treatment.  If he has advanced disease, systemic therapy remains his best option for palliative purposes.  He has no evidence of metastatic disease, treatment so with that neoadjuvant chemotherapy would also be reasonable and attempt for curative resection.  The logistics and rationale for using chemotherapy was reviewed today in detail.  Complication associated with cisplatin and gemcitabine chemotherapy was discussed.  These complications include nausea, vomiting, myelosuppression, fatigue, infusion  related complications, renal insufficiency, neutropenia, neutropenic sepsis and rarely serious thrombosis, hospitalization and death.  The benefit would also if he has an excellent response to chemotherapy, curative surgical resection may be attempted.  The plan is to treat with gemcitabine and cisplatin on day 1, gemcitabine day 8 out of a 21-day cycle.  Anticipate needing 4 to 6 cycles of therapy        2.  IV access: Risks and benefits of using Port-A-Cath versus peripheral veins was discussed today.  Complication associated with Port-A-Cath insertion include bleeding, infection and thrombosis.  After discussing the risks and benefits, he is agreeable to proceed.   3.  Antiemetics: Prescription for Compazine was made available to him.   4.  Renal function surveillance: we will continue to monitor on platinum therapy.  He has a creatinine clearance of 45 cc per minute and will reduce platinum dose by 50% and continue to monitor.   5.  Goals of care: Treatment is  potentially curative unless he has metastatic disease.   6.  Follow-up: will be in the immediate future to start chemotherapy.  60  minutes were dedicated to this visit. The time was spent on reviewing laboratory data, imaging studies, discussing treatment options,  and answering questions regarding future plan.    A copy of this consult has been forwarded to the requesting physician.

## 2021-03-11 ENCOUNTER — Other Ambulatory Visit: Payer: Self-pay | Admitting: Physician Assistant

## 2021-03-12 ENCOUNTER — Other Ambulatory Visit: Payer: Self-pay | Admitting: Radiology

## 2021-03-13 ENCOUNTER — Ambulatory Visit (HOSPITAL_COMMUNITY)
Admission: RE | Admit: 2021-03-13 | Discharge: 2021-03-13 | Disposition: A | Discharge status: Home / Self Care | Payer: Medicare Other | Source: Ambulatory Visit | Attending: Oncology | Admitting: Oncology

## 2021-03-13 ENCOUNTER — Other Ambulatory Visit: Payer: Self-pay | Admitting: Oncology

## 2021-03-13 ENCOUNTER — Other Ambulatory Visit: Payer: Self-pay

## 2021-03-13 ENCOUNTER — Encounter (HOSPITAL_COMMUNITY): Payer: Self-pay

## 2021-03-13 DIAGNOSIS — F17210 Nicotine dependence, cigarettes, uncomplicated: Secondary | ICD-10-CM | POA: Insufficient documentation

## 2021-03-13 DIAGNOSIS — Z79899 Other long term (current) drug therapy: Secondary | ICD-10-CM | POA: Insufficient documentation

## 2021-03-13 DIAGNOSIS — Z7982 Long term (current) use of aspirin: Secondary | ICD-10-CM | POA: Insufficient documentation

## 2021-03-13 DIAGNOSIS — C679 Malignant neoplasm of bladder, unspecified: Secondary | ICD-10-CM | POA: Diagnosis present

## 2021-03-13 HISTORY — PX: IR IMAGING GUIDED PORT INSERTION: IMG5740

## 2021-03-13 LAB — PROTIME-INR
INR: 1 (ref 0.8–1.2)
Prothrombin Time: 13.3 seconds (ref 11.4–15.2)

## 2021-03-13 LAB — CBC WITH DIFFERENTIAL/PLATELET
Abs Immature Granulocytes: 0.01 10*3/uL (ref 0.00–0.07)
Basophils Absolute: 0 10*3/uL (ref 0.0–0.1)
Basophils Relative: 1 %
Eosinophils Absolute: 0.2 10*3/uL (ref 0.0–0.5)
Eosinophils Relative: 4 %
HCT: 41.9 % (ref 39.0–52.0)
Hemoglobin: 14 g/dL (ref 13.0–17.0)
Immature Granulocytes: 0 %
Lymphocytes Relative: 33 %
Lymphs Abs: 1.7 10*3/uL (ref 0.7–4.0)
MCH: 32.3 pg (ref 26.0–34.0)
MCHC: 33.4 g/dL (ref 30.0–36.0)
MCV: 96.8 fL (ref 80.0–100.0)
Monocytes Absolute: 0.5 10*3/uL (ref 0.1–1.0)
Monocytes Relative: 10 %
Neutro Abs: 2.7 10*3/uL (ref 1.7–7.7)
Neutrophils Relative %: 52 %
Platelets: 153 10*3/uL (ref 150–400)
RBC: 4.33 MIL/uL (ref 4.22–5.81)
RDW: 12.4 % (ref 11.5–15.5)
WBC: 5.1 10*3/uL (ref 4.0–10.5)
nRBC: 0 % (ref 0.0–0.2)

## 2021-03-13 MED ORDER — LIDOCAINE-EPINEPHRINE (PF) 2 %-1:200000 IJ SOLN
INTRAMUSCULAR | Status: AC
  Filled 2021-03-13: qty 20

## 2021-03-13 MED ORDER — HEPARIN SOD (PORK) LOCK FLUSH 100 UNIT/ML IV SOLN
INTRAVENOUS | Status: AC
  Filled 2021-03-13: qty 5

## 2021-03-13 MED ORDER — LIDOCAINE-EPINEPHRINE (PF) 1 %-1:200000 IJ SOLN
INTRAMUSCULAR | Status: DC | PRN
  Administered 2021-03-13: 20 mL

## 2021-03-13 MED ORDER — FENTANYL CITRATE (PF) 100 MCG/2ML IJ SOLN
INTRAMUSCULAR | Status: DC | PRN
  Administered 2021-03-13 (×2): 50 ug via INTRAVENOUS

## 2021-03-13 MED ORDER — SODIUM CHLORIDE 0.9 % IV SOLN: INTRAVENOUS | Status: DC

## 2021-03-13 MED ORDER — HEPARIN SOD (PORK) LOCK FLUSH 100 UNIT/ML IV SOLN
INTRAVENOUS | Status: DC | PRN
  Administered 2021-03-13: 500 [IU] via INTRAVENOUS

## 2021-03-13 MED ORDER — FENTANYL CITRATE (PF) 100 MCG/2ML IJ SOLN
INTRAMUSCULAR | Status: AC
  Filled 2021-03-13: qty 2

## 2021-03-13 MED ORDER — MIDAZOLAM HCL 2 MG/2ML IJ SOLN
INTRAMUSCULAR | Status: DC | PRN
  Administered 2021-03-13 (×2): 1 mg via INTRAVENOUS

## 2021-03-13 MED ORDER — MIDAZOLAM HCL 2 MG/2ML IJ SOLN
INTRAMUSCULAR | Status: AC
  Filled 2021-03-13: qty 4

## 2021-03-13 NOTE — Discharge Instructions (Signed)

## 2021-03-13 NOTE — Procedures (Signed)
Interventional Radiology Procedure Note  Procedure: Placement of a right IJ approach single lumen PowerPort.  Tip is positioned at the superior cavoatrial junction and catheter is ready for immediate use.  Complications: No immediate Recommendations:  - Ok to shower tomorrow - Do not submerge for 7 days - Routine line care   Signed,  Virgene Tirone K. Marque Rademaker, MD   

## 2021-03-13 NOTE — Consult Note (Addendum)
Chief Complaint: Patient was seen in consultation today for Port-A-Cath placement  Referring Physician(s): Westby N  Supervising Physician: Jacqulynn Cadet  Patient Status: [facility] - Out-pt  History of Present Illness: Robert Blackwell is an 80 y.o. male smoker with history of chronic renal sufficiency , remote cerebellar infarcts, seizures and prior history of hematuria who was subsequently found to have bladder carcinoma, s/p TURBT on 02/08/21.  He presents today for Port-A-Cath placement to assist with treatment.  Past Medical History:  Diagnosis Date   Seizure [facility])     Past Surgical History:  Procedure Laterality Date   VASCULAR SURGERY     VEIN SURGERY      Allergies: Patient has no known allergies.  Medications: Prior to Admission medications   Medication Sig Start Date End Date Taking? Authorizing Provider  aspirin EC 81 MG tablet Take 81 mg by mouth daily. Swallow whole.   Yes [provider]  levETIRAcetam (KEPPRA) 500 MG tablet Take 1 tablet (500 mg total) by mouth 2 (two) times daily. 08/15/20 11/08/21 Yes Suzzanne Cloud, NP  Multiple Vitamin (MULTIVITAMIN WITH MINERALS) TABS tablet Take 1 tablet by mouth daily.   Yes [provider]  thiamine 100 MG tablet Take 1 tablet (100 mg total) by mouth daily. 02/14/20  Yes Mariel Aloe, MD  lidocaine-prilocaine (EMLA) cream Apply 1 application topically as needed. 03/07/21   Wyatt Portela, MD  prochlorperazine (COMPAZINE) 10 MG tablet Take 1 tablet (10 mg total) by mouth every 6 (six) hours as needed for nausea or vomiting. 03/07/21   Wyatt Portela, MD     Family History  Problem Relation Age of Onset   Heart disease Mother    Heart disease Father    Seizures Neg Hx     Social History   Socioeconomic History   Marital status: Married    Spouse name: Not on file   Number of children: 3   Years of education: two years college   Highest education level: Not on file  Occupational History    Occupation: Retired  Tobacco Use   Smoking status: Every Day    Packs/day: 0.75    Types: Cigarettes   Smokeless tobacco: Never  Substance and Sexual Activity   Alcohol use: Yes    Comment: 3 beers daily   Drug use: Never   Sexual activity: Not on file  Other Topics Concern   Not on file  Social History Narrative   Lives at home with his wife.   Right-handed.   4 cups caffeine per day.   Social Determinants of Health   Financial Resource Strain: Not on file  Food Insecurity: Not on file  Transportation Needs: Not on file  Physical Activity: Not on file  Stress: Not on file  Social Connections: Not on file      Review of Systems currently denies fever, headache, chest pain, dyspnea, cough, abdominal/back pain, nausea, vomiting or bleeding.  Vital Signs: BP 138/73   Pulse 72   Temp 97.6 F (36.4 C) (Oral)   Resp 15   SpO2 99%   Physical Exam awake, alert.  Chest with distant breath sounds bilaterally.  Heart with regular rate and rhythm,?  Soft murmur.  Abdomen soft, positive bowel sounds, nontender.  No lower extremity edema.  Imaging: No results found.  Labs:  CBC: Recent Labs    03/13/21 0749  WBC 5.1  HGB 14.0  HCT 41.9  PLT 153    COAGS: Recent Labs  03/13/21 0749  INR 1.0    BMP: No results for input(s): NA, K, CL, CO2, GLUCOSE, BUN, CALCIUM, CREATININE, GFRNONAA, GFRAA in the last 8760 hours.  Invalid input(s): CMP  LIVER FUNCTION TESTS: No results for input(s): BILITOT, AST, ALT, ALKPHOS, PROT, ALBUMIN in the last 8760 hours.  TUMOR MARKERS: No results for input(s): AFPTM, CEA, CA199, CHROMGRNA in the last 8760 hours.  Assessment and Plan: 80 y.o. male smoker with history of chronic renal sufficiency , remote cerebellar infarcts, seizures and prior history of hematuria who was subsequently found to have bladder carcinoma, s/p TURBT on 02/08/21.  He presents today for Port-A-Cath placement to assist with treatment.Risks and benefits of  image guided port-a-catheter placement was discussed with the patient including, but not limited to bleeding, infection, pneumothorax, or fibrin sheath development and need for additional procedures.  All of the patient's questions were answered, patient is agreeable to proceed. Consent signed and in chart.    Thank you for this interesting consult.  I greatly enjoyed meeting Robert Blackwell and look forward to participating in their care.  A copy of this report was sent to the requesting provider on this date.  Electronically Signed: D. Rowe Robert, PA-C 03/13/2021, 8:23 AM   I spent a total of   25 minutes  in face to face in clinical consultation, greater than 50% of which was counseling/coordinating care for Port-A-Cath placement

## 2021-03-22 ENCOUNTER — Telehealth: Payer: Self-pay | Admitting: Oncology

## 2021-03-22 NOTE — Telephone Encounter (Signed)
Called patient regarding upcoming appointments, spoke with patient's spouse. Patient will be notified. °

## 2021-03-25 ENCOUNTER — Inpatient Hospital Stay: Payer: Medicare Other | Attending: Oncology

## 2021-03-25 ENCOUNTER — Ambulatory Visit (HOSPITAL_COMMUNITY)
Admission: RE | Admit: 2021-03-25 | Discharge: 2021-03-25 | Disposition: A | Discharge status: Home / Self Care | Payer: Medicare Other | Source: Ambulatory Visit | Attending: Oncology | Admitting: Oncology

## 2021-03-25 ENCOUNTER — Other Ambulatory Visit: Payer: Self-pay

## 2021-03-25 DIAGNOSIS — K769 Liver disease, unspecified: Secondary | ICD-10-CM | POA: Insufficient documentation

## 2021-03-25 DIAGNOSIS — G96191 Perineural cyst: Secondary | ICD-10-CM | POA: Diagnosis not present

## 2021-03-25 DIAGNOSIS — Z5111 Encounter for antineoplastic chemotherapy: Secondary | ICD-10-CM | POA: Insufficient documentation

## 2021-03-25 DIAGNOSIS — Z79899 Other long term (current) drug therapy: Secondary | ICD-10-CM | POA: Insufficient documentation

## 2021-03-25 DIAGNOSIS — C679 Malignant neoplasm of bladder, unspecified: Secondary | ICD-10-CM | POA: Insufficient documentation

## 2021-03-25 DIAGNOSIS — I7143 Infrarenal abdominal aortic aneurysm, without rupture: Secondary | ICD-10-CM | POA: Insufficient documentation

## 2021-03-25 DIAGNOSIS — Z7982 Long term (current) use of aspirin: Secondary | ICD-10-CM | POA: Insufficient documentation

## 2021-03-25 DIAGNOSIS — K5730 Diverticulosis of large intestine without perforation or abscess without bleeding: Secondary | ICD-10-CM | POA: Insufficient documentation

## 2021-03-25 DIAGNOSIS — J439 Emphysema, unspecified: Secondary | ICD-10-CM | POA: Insufficient documentation

## 2021-03-25 DIAGNOSIS — I700 Atherosclerosis of aorta: Secondary | ICD-10-CM | POA: Insufficient documentation

## 2021-03-25 DIAGNOSIS — N1330 Unspecified hydronephrosis: Secondary | ICD-10-CM | POA: Diagnosis not present

## 2021-03-25 LAB — GLUCOSE, CAPILLARY: Glucose-Capillary: 116 mg/dL — ABNORMAL HIGH (ref 70–99)

## 2021-03-25 MED ORDER — FLUDEOXYGLUCOSE F - 18 (FDG) INJECTION
8.4000 | Freq: Once | INTRAVENOUS | Status: AC
  Administered 2021-03-25: 8.3 via INTRAVENOUS

## 2021-03-25 MED FILL — Fosaprepitant Dimeglumine For IV Infusion 150 MG (Base Eq): INTRAVENOUS | Qty: 5 | Status: AC

## 2021-03-25 MED FILL — Dexamethasone Sodium Phosphate Inj 100 MG/10ML: INTRAMUSCULAR | Qty: 1 | Status: AC

## 2021-03-26 ENCOUNTER — Other Ambulatory Visit: Payer: Self-pay | Admitting: Oncology

## 2021-03-26 ENCOUNTER — Inpatient Hospital Stay: Payer: Medicare Other

## 2021-03-26 ENCOUNTER — Other Ambulatory Visit: Payer: Self-pay

## 2021-03-26 VITALS — BP 168/76 | HR 67 | Resp 17 | Wt 174.5 lb

## 2021-03-26 DIAGNOSIS — Z5111 Encounter for antineoplastic chemotherapy: Secondary | ICD-10-CM | POA: Diagnosis not present

## 2021-03-26 DIAGNOSIS — Z7982 Long term (current) use of aspirin: Secondary | ICD-10-CM | POA: Diagnosis not present

## 2021-03-26 DIAGNOSIS — I7143 Infrarenal abdominal aortic aneurysm, without rupture: Secondary | ICD-10-CM | POA: Diagnosis not present

## 2021-03-26 DIAGNOSIS — Z79899 Other long term (current) drug therapy: Secondary | ICD-10-CM | POA: Diagnosis not present

## 2021-03-26 DIAGNOSIS — C679 Malignant neoplasm of bladder, unspecified: Secondary | ICD-10-CM

## 2021-03-26 DIAGNOSIS — Z95828 Presence of other vascular implants and grafts: Secondary | ICD-10-CM

## 2021-03-26 LAB — CBC WITH DIFFERENTIAL (CANCER CENTER ONLY)
Abs Immature Granulocytes: 0.01 10*3/uL (ref 0.00–0.07)
Basophils Absolute: 0 10*3/uL (ref 0.0–0.1)
Basophils Relative: 0 %
Eosinophils Absolute: 0.1 10*3/uL (ref 0.0–0.5)
Eosinophils Relative: 2 %
HCT: 37.8 % — ABNORMAL LOW (ref 39.0–52.0)
Hemoglobin: 12.8 g/dL — ABNORMAL LOW (ref 13.0–17.0)
Immature Granulocytes: 0 %
Lymphocytes Relative: 26 %
Lymphs Abs: 1.4 10*3/uL (ref 0.7–4.0)
MCH: 31.9 pg (ref 26.0–34.0)
MCHC: 33.9 g/dL (ref 30.0–36.0)
MCV: 94.3 fL (ref 80.0–100.0)
Monocytes Absolute: 0.5 10*3/uL (ref 0.1–1.0)
Monocytes Relative: 9 %
Neutro Abs: 3.3 10*3/uL (ref 1.7–7.7)
Neutrophils Relative %: 63 %
Platelet Count: 142 10*3/uL — ABNORMAL LOW (ref 150–400)
RBC: 4.01 MIL/uL — ABNORMAL LOW (ref 4.22–5.81)
RDW: 12.3 % (ref 11.5–15.5)
WBC Count: 5.3 10*3/uL (ref 4.0–10.5)
nRBC: 0 % (ref 0.0–0.2)

## 2021-03-26 LAB — CMP (CANCER CENTER ONLY)
ALT: 19 U/L (ref 0–44)
AST: 21 U/L (ref 15–41)
Albumin: 3.7 g/dL (ref 3.5–5.0)
Alkaline Phosphatase: 71 U/L (ref 38–126)
Anion gap: 12 (ref 5–15)
BUN: 18 mg/dL (ref 8–23)
CO2: 24 mmol/L (ref 22–32)
Calcium: 9.1 mg/dL (ref 8.9–10.3)
Chloride: 98 mmol/L (ref 98–111)
Creatinine: 1.47 mg/dL — ABNORMAL HIGH (ref 0.61–1.24)
GFR, Estimated: 48 mL/min — ABNORMAL LOW (ref >60)
Glucose, Bld: 168 mg/dL — ABNORMAL HIGH (ref 70–99)
Potassium: 4.2 mmol/L (ref 3.5–5.1)
Sodium: 134 mmol/L — ABNORMAL LOW (ref 135–145)
Total Bilirubin: 0.7 mg/dL (ref 0.3–1.2)
Total Protein: 7.2 g/dL (ref 6.5–8.1)

## 2021-03-26 LAB — MAGNESIUM: Magnesium: 1.5 mg/dL — ABNORMAL LOW (ref 1.7–2.4)

## 2021-03-26 MED ORDER — SODIUM CHLORIDE 0.9 % IV SOLN
1000.0000 mg/m2 | Freq: Once | INTRAVENOUS | Status: AC
  Administered 2021-03-26: 1938 mg via INTRAVENOUS
  Filled 2021-03-26: qty 50.97

## 2021-03-26 MED ORDER — SODIUM CHLORIDE 0.9 % IV SOLN
35.0000 mg/m2 | Freq: Once | INTRAVENOUS | Status: AC
  Administered 2021-03-26: 68 mg via INTRAVENOUS
  Filled 2021-03-26: qty 68

## 2021-03-26 MED ORDER — SODIUM CHLORIDE 0.9 % IV SOLN
10.0000 mg | Freq: Once | INTRAVENOUS | Status: AC
  Administered 2021-03-26: 10 mg via INTRAVENOUS
  Filled 2021-03-26: qty 10

## 2021-03-26 MED ORDER — HEPARIN SOD (PORK) LOCK FLUSH 100 UNIT/ML IV SOLN
500.0000 [IU] | Freq: Once | INTRAVENOUS | Status: AC | PRN
  Administered 2021-03-26: 500 [IU]

## 2021-03-26 MED ORDER — PALONOSETRON HCL INJECTION 0.25 MG/5ML
0.2500 mg | Freq: Once | INTRAVENOUS | Status: AC
  Administered 2021-03-26: 0.25 mg via INTRAVENOUS
  Filled 2021-03-26: qty 5

## 2021-03-26 MED ORDER — SODIUM CHLORIDE 0.9% FLUSH
10.0000 mL | INTRAVENOUS | Status: DC | PRN
  Administered 2021-03-26: 10 mL

## 2021-03-26 MED ORDER — SODIUM CHLORIDE 0.9 % IV SOLN
150.0000 mg | Freq: Once | INTRAVENOUS | Status: AC
  Administered 2021-03-26: 150 mg via INTRAVENOUS
  Filled 2021-03-26: qty 150

## 2021-03-26 MED ORDER — SODIUM CHLORIDE 0.9% FLUSH
10.0000 mL | Freq: Once | INTRAVENOUS | Status: AC
  Administered 2021-03-26: 10 mL via INTRAVENOUS

## 2021-03-26 MED ORDER — SODIUM CHLORIDE 0.9 % IV SOLN: Freq: Once | INTRAVENOUS | Status: AC

## 2021-03-26 MED ORDER — MAGNESIUM SULFATE 2 GM/50ML IV SOLN
2.0000 g | Freq: Once | INTRAVENOUS | Status: AC
  Administered 2021-03-26: 2 g via INTRAVENOUS
  Filled 2021-03-26: qty 50

## 2021-03-26 MED ORDER — POTASSIUM CHLORIDE IN NACL 20-0.9 MEQ/L-% IV SOLN
Freq: Once | INTRAVENOUS | Status: AC
  Filled 2021-03-26: qty 1000

## 2021-03-26 NOTE — Patient Instructions (Signed)
[facility]  Discharge Instructions: Thank you for choosing [facility] to provide your oncology and hematology care.   If you have a lab appointment with the Cancer Center, please go directly to the Cancer Center and check in at the registration area.   Wear comfortable clothing and clothing appropriate for easy access to any Portacath or PICC line.   We strive to give you quality time with your provider. You may need to reschedule your appointment if you arrive late (15 or more minutes).  Arriving late affects you and other patients whose appointments are after yours.  Also, if you miss three or more appointments without notifying the office, you may be dismissed from the clinic at the provider's discretion.      For prescription refill requests, have your pharmacy contact our office and allow 72 hours for refills to be completed.    Today you received the following chemotherapy and/or immunotherapy agents Gemzar and Cisplatin       To help prevent nausea and vomiting after your treatment, we encourage you to take your nausea medication as directed.  BELOW ARE SYMPTOMS THAT SHOULD BE REPORTED IMMEDIATELY: *FEVER GREATER THAN 100.4 F (38 C) OR HIGHER *CHILLS OR SWEATING *NAUSEA AND VOMITING THAT IS NOT CONTROLLED WITH YOUR NAUSEA MEDICATION *UNUSUAL SHORTNESS OF BREATH *UNUSUAL BRUISING OR BLEEDING *URINARY PROBLEMS (pain or burning when urinating, or frequent urination) *BOWEL PROBLEMS (unusual diarrhea, constipation, pain near the anus) TENDERNESS IN MOUTH AND THROAT WITH OR WITHOUT PRESENCE OF ULCERS (sore throat, sores in mouth, or a toothache) UNUSUAL RASH, SWELLING OR PAIN  UNUSUAL VAGINAL DISCHARGE OR ITCHING   Items with * indicate a potential emergency and should be followed up as soon as possible or go to the Emergency Department if any problems should occur.  Please show the CHEMOTHERAPY ALERT CARD or IMMUNOTHERAPY ALERT CARD at  check-in to the Emergency Department and triage nurse.  Should you have questions after your visit or need to cancel or reschedule your appointment, please contact [facility]  Dept: [phone]  and follow the prompts.  Office hours are 8:00 a.m. to 4:30 p.m. Monday - Friday. Please note that voicemails left after 4:00 p.m. may not be returned until the following business day.  We are closed weekends and major holidays. You have access to a nurse at all times for urgent questions. Please call the main number to the clinic Dept: [phone] and follow the prompts.   For any non-urgent questions, you may also contact your provider using MyChart. We now offer e-Visits for anyone 18 and older to request care online for non-urgent symptoms. For details visit [email or web address].   Also download the MyChart app! Go to the app store, search "MyChart", open the app, select [place], and log in with your MyChart username and password.  Due to Covid, a mask is required upon entering the hospital/clinic. If you do not have a mask, one will be given to you upon arrival. For doctor visits, patients may have 1 support person aged 18 or older with them. For treatment visits, patients cannot have anyone with them due to current Covid guidelines and our immunocompromised population.   

## 2021-04-01 ENCOUNTER — Encounter: Payer: Self-pay | Admitting: Oncology

## 2021-04-01 NOTE — Progress Notes (Signed)
Called pt to introduce myself as his Arboriculturist and to discuss the [employer].  Unfortunately there aren't any foundations offering copay assistance for his Dx and the type of ins he has.  Pt was unavailable so I spoke to his wife and informed her of the [employer] and went over what it covers.  She would like to think about it so I requested the registration staff give them my card to contact me if they would like to apply for the grant and for any questions or concerns they may have in the future.

## 2021-04-02 ENCOUNTER — Inpatient Hospital Stay: Payer: Medicare Other

## 2021-04-02 ENCOUNTER — Inpatient Hospital Stay: Payer: Medicare Other | Admitting: Oncology

## 2021-04-02 ENCOUNTER — Other Ambulatory Visit: Payer: Self-pay

## 2021-04-02 VITALS — BP 162/68 | HR 75 | Temp 97.7°F | Resp 16 | Ht 71.0 in | Wt 173.0 lb

## 2021-04-02 DIAGNOSIS — C679 Malignant neoplasm of bladder, unspecified: Secondary | ICD-10-CM | POA: Diagnosis not present

## 2021-04-02 DIAGNOSIS — Z95828 Presence of other vascular implants and grafts: Secondary | ICD-10-CM

## 2021-04-02 LAB — MAGNESIUM: Magnesium: 1.7 mg/dL (ref 1.7–2.4)

## 2021-04-02 LAB — CBC WITH DIFFERENTIAL (CANCER CENTER ONLY)
Abs Immature Granulocytes: 0.01 10*3/uL (ref 0.00–0.07)
Basophils Absolute: 0 10*3/uL (ref 0.0–0.1)
Basophils Relative: 1 %
Eosinophils Absolute: 0.1 10*3/uL (ref 0.0–0.5)
Eosinophils Relative: 2 %
HCT: 35.1 % — ABNORMAL LOW (ref 39.0–52.0)
Hemoglobin: 12.1 g/dL — ABNORMAL LOW (ref 13.0–17.0)
Immature Granulocytes: 0 %
Lymphocytes Relative: 53 %
Lymphs Abs: 1.5 10*3/uL (ref 0.7–4.0)
MCH: 31.8 pg (ref 26.0–34.0)
MCHC: 34.5 g/dL (ref 30.0–36.0)
MCV: 92.4 fL (ref 80.0–100.0)
Monocytes Absolute: 0.1 10*3/uL (ref 0.1–1.0)
Monocytes Relative: 4 %
Neutro Abs: 1.2 10*3/uL — ABNORMAL LOW (ref 1.7–7.7)
Neutrophils Relative %: 40 %
Platelet Count: 97 10*3/uL — ABNORMAL LOW (ref 150–400)
RBC: 3.8 MIL/uL — ABNORMAL LOW (ref 4.22–5.81)
RDW: 12 % (ref 11.5–15.5)
WBC Count: 2.9 10*3/uL — ABNORMAL LOW (ref 4.0–10.5)
nRBC: 0 % (ref 0.0–0.2)

## 2021-04-02 LAB — CMP (CANCER CENTER ONLY)
ALT: 38 U/L (ref 0–44)
AST: 28 U/L (ref 15–41)
Albumin: 3.8 g/dL (ref 3.5–5.0)
Alkaline Phosphatase: 86 U/L (ref 38–126)
Anion gap: 11 (ref 5–15)
BUN: 20 mg/dL (ref 8–23)
CO2: 22 mmol/L (ref 22–32)
Calcium: 9.5 mg/dL (ref 8.9–10.3)
Chloride: 101 mmol/L (ref 98–111)
Creatinine: 1.37 mg/dL — ABNORMAL HIGH (ref 0.61–1.24)
GFR, Estimated: 52 mL/min — ABNORMAL LOW (ref >60)
Glucose, Bld: 102 mg/dL — ABNORMAL HIGH (ref 70–99)
Potassium: 4.4 mmol/L (ref 3.5–5.1)
Sodium: 134 mmol/L — ABNORMAL LOW (ref 135–145)
Total Bilirubin: 0.7 mg/dL (ref 0.3–1.2)
Total Protein: 7.4 g/dL (ref 6.5–8.1)

## 2021-04-02 MED ORDER — PROCHLORPERAZINE MALEATE 10 MG PO TABS
10.0000 mg | ORAL_TABLET | Freq: Once | ORAL | Status: AC
  Administered 2021-04-02: 10 mg via ORAL
  Filled 2021-04-02: qty 1

## 2021-04-02 MED ORDER — SODIUM CHLORIDE 0.9 % IV SOLN: Freq: Once | INTRAVENOUS | Status: AC

## 2021-04-02 MED ORDER — SODIUM CHLORIDE 0.9% FLUSH
10.0000 mL | INTRAVENOUS | Status: DC | PRN
  Administered 2021-04-02: 10 mL

## 2021-04-02 MED ORDER — SODIUM CHLORIDE 0.9 % IV SOLN
800.0000 mg/m2 | Freq: Once | INTRAVENOUS | Status: AC
  Administered 2021-04-02: 1558 mg via INTRAVENOUS
  Filled 2021-04-02: qty 40.98

## 2021-04-02 MED ORDER — HEPARIN SOD (PORK) LOCK FLUSH 100 UNIT/ML IV SOLN
500.0000 [IU] | Freq: Once | INTRAVENOUS | Status: AC | PRN
  Administered 2021-04-02: 500 [IU]

## 2021-04-02 MED ORDER — SODIUM CHLORIDE 0.9% FLUSH
10.0000 mL | Freq: Once | INTRAVENOUS | Status: AC
  Administered 2021-04-02: 10 mL

## 2021-04-02 NOTE — Patient Instructions (Signed)
[facility]  Discharge Instructions: Thank you for choosing [facility] to provide your oncology and hematology care.   If you have a lab appointment with the Cancer Center, please go directly to the Cancer Center and check in at the registration area.   Wear comfortable clothing and clothing appropriate for easy access to any Portacath or PICC line.   We strive to give you quality time with your provider. You may need to reschedule your appointment if you arrive late (15 or more minutes).  Arriving late affects you and other patients whose appointments are after yours.  Also, if you miss three or more appointments without notifying the office, you may be dismissed from the clinic at the provider's discretion.      For prescription refill requests, have your pharmacy contact our office and allow 72 hours for refills to be completed.    Today you received the following chemotherapy and/or immunotherapy agents Gemzar      To help prevent nausea and vomiting after your treatment, we encourage you to take your nausea medication as directed.  BELOW ARE SYMPTOMS THAT SHOULD BE REPORTED IMMEDIATELY: *FEVER GREATER THAN 100.4 F (38 C) OR HIGHER *CHILLS OR SWEATING *NAUSEA AND VOMITING THAT IS NOT CONTROLLED WITH YOUR NAUSEA MEDICATION *UNUSUAL SHORTNESS OF BREATH *UNUSUAL BRUISING OR BLEEDING *URINARY PROBLEMS (pain or burning when urinating, or frequent urination) *BOWEL PROBLEMS (unusual diarrhea, constipation, pain near the anus) TENDERNESS IN MOUTH AND THROAT WITH OR WITHOUT PRESENCE OF ULCERS (sore throat, sores in mouth, or a toothache) UNUSUAL RASH, SWELLING OR PAIN  UNUSUAL VAGINAL DISCHARGE OR ITCHING   Items with * indicate a potential emergency and should be followed up as soon as possible or go to the Emergency Department if any problems should occur.  Please show the CHEMOTHERAPY ALERT CARD or IMMUNOTHERAPY ALERT CARD at check-in to the  Emergency Department and triage nurse.  Should you have questions after your visit or need to cancel or reschedule your appointment, please contact [facility] [place]  Dept: [phone]  and follow the prompts.  Office hours are 8:00 a.m. to 4:30 p.m. Monday - Friday. Please note that voicemails left after 4:00 p.m. may not be returned until the following business day.  We are closed weekends and major holidays. You have access to a nurse at all times for urgent questions. Please call the main number to the clinic Dept: [phone] and follow the prompts.   For any non-urgent questions, you may also contact your provider using MyChart. We now offer e-Visits for anyone 18 and older to request care online for non-urgent symptoms. For details visit [email or web address].   Also download the MyChart app! Go to the app store, search "MyChart", open the app, select [place], and log in with your MyChart username and password.  Due to Covid, a mask is required upon entering the hospital/clinic. If you do not have a mask, one will be given to you upon arrival. For doctor visits, patients may have 1 support person aged 18 or older with them. For treatment visits, patients cannot have anyone with them due to current Covid guidelines and our immunocompromised population.   

## 2021-04-02 NOTE — Progress Notes (Signed)
Hematology and Oncology Follow Up Visit  Robert Blackwell [id number] [birth year] 80 y.o. 04/02/2021 11:30 AM Belva Bertin, [place], [place], [place], *   Principle Diagnosis: 80 year old man with bladder cancer diagnosed in September 2022.  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   Prior Therapy:  TURBT on February 08, 2021 and the final pathology showed high-grade urothelial carcinoma with muscle invasion.    Current therapy:  Neoadjuvant chemotherapy utilizing gemcitabine and cisplatin started on March 26, 2021.  He is here for day 8 cycle 1 of therapy.  Interim History: Mr. Lisenbee returns today for a follow-up visit.  Since the last visit, he received day 1 of cycle 1 of chemotherapy without any complications.  He denies any nausea, vomiting or abdominal pain.  He denies any recent hospitalizations or illnesses.  He denies any worsening neuropathy or excessive fatigue.  He is able to eat well without any issues.     Medications: I have reviewed the patient's current medications.  Current Outpatient Medications  Medication Sig Dispense Refill   aspirin EC 81 MG tablet Take 81 mg by mouth daily. Swallow whole.     levETIRAcetam (KEPPRA) 500 MG tablet Take 1 tablet (500 mg total) by mouth 2 (two) times daily. 180 tablet 4   lidocaine-prilocaine (EMLA) cream Apply 1 application topically as needed. 30 g 0   Multiple Vitamin (MULTIVITAMIN WITH MINERALS) TABS tablet Take 1 tablet by mouth daily.     prochlorperazine (COMPAZINE) 10 MG tablet Take 1 tablet (10 mg total) by mouth every 6 (six) hours as needed for nausea or vomiting. 30 tablet 0   thiamine 100 MG tablet Take 1 tablet (100 mg total) by mouth daily.     No current facility-administered medications for this visit.     Allergies: No Known Allergies   Physical Exam: Blood pressure (!) 162/68, pulse 75, temperature 97.7 F (36.5 C), temperature source Oral, resp. rate 16, height 5\' 11"  (1.803 m),  weight 173 lb (78.5 kg), SpO2 99 %.  ECOG:     General appearance: Comfortable appearing without any discomfort Head: Normocephalic without any trauma Oropharynx: Mucous membranes are moist and pink without any thrush or ulcers. Eyes: Pupils are equal and round reactive to light. Lymph nodes: No cervical, supraclavicular, inguinal or axillary lymphadenopathy.   Heart:regular rate and rhythm.  S1 and S2 without leg edema. Lung: Clear without any rhonchi or wheezes.  No dullness to percussion. Abdomin: Soft, nontender, nondistended with good bowel sounds.  No hepatosplenomegaly. Musculoskeletal: No joint deformity or effusion.  Full range of motion noted. Neurological: No deficits noted on motor, sensory and deep tendon reflex exam. Skin: No petechial rash or dryness.  Appeared moist.       Lab Results: Lab Results  Component Value Date   WBC 5.3 03/26/2021   HGB 12.8 (L) 03/26/2021   HCT 37.8 (L) 03/26/2021   MCV 94.3 03/26/2021   PLT 142 (L) 03/26/2021     Chemistry      Component Value Date/Time   NA 134 (L) 03/26/2021 0917   K 4.2 03/26/2021 0917   CL 98 03/26/2021 0917   CO2 24 03/26/2021 0917   BUN 18 03/26/2021 0917   CREATININE 1.47 (H) 03/26/2021 0917      Component Value Date/Time   CALCIUM 9.1 03/26/2021 0917   ALKPHOS 71 03/26/2021 0917   AST 21 03/26/2021 0917   ALT 19 03/26/2021 0917   BILITOT 0.7 03/26/2021 1937  IMPRESSION: 1. Moderate activity associated with the right posterior urinary bladder tumor, a representative portion had a maximum SUV of 6.6. 2. No findings of hypermetabolic activity or hypermetabolic metastatic disease. 3. Regarding the liver lesion seen on prior CT, this 1.4 cm hypodense lesion in the subcapsular right hepatic lobe does not have perceived hypermetabolic activity greater than the liver. This is unlikely to be a metastatic lesion, although surveillance is still warranted given the nonspecific nature of the  lesion. 4. 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 Given the likelihood of benign etiology I would suggest surveillance in the context of the patient's anticipated cancer follow up imaging. 5. Extensive atherosclerosis along with a 3.0 cm in diameter infrarenal abdominal aortic aneurysm. Recommend follow-up ultrasound every 3 years, although this could also be adequately followed in the context of the patient's anticipated cancer follow up imaging. This recommendation follows ACR consensus guidelines: White Paper of the ACR Incidental Findings Committee II on Vascular Findings. J Am Coll Radiol 2013; 10:789-794. Aortic aneurysm NOS (ICD10-I71.9). 6. Other imaging findings of potential clinical significance: Aortic Atherosclerosis (ICD10-I70.0) and Emphysema (ICD10-J43.9). Fatty prominence of the interatrial septum. Stable right renal atrophy with right hydronephrosis and hydroureter. Sigmoid colon diverticulosis. Small suspected Schmorl's node along the inferior endplate of L4.     Impression and Plan:   80 year old with:  1.  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    Treatment choices at this time were discussed and PET scan obtained on 03/25/2021 was also reviewed and discussed with the patient.  He has no evidence of metastatic disease and currently receiving neoadjuvant chemotherapy for curative intent to be followed by radical cystectomy.  Risks and benefits of continuing chemotherapy were discussed.  Complications that include myelosuppression and worsening renal failure were reiterated.  Laboratory data reviewed today showed an adequate counts to proceed with treatment today.  We will adjust gemcitabine dosing given his mild neutropenia and thrombocytopenia.       2.  IV access: Port-A-Cath inserted without any  issues.  This will continue to be in use for treatment.   3.  Antiemetics: Compazine is available to him without any nausea or vomiting.   4.  Renal function surveillance: Creatinine clearance remains around 50 cc/min and will continue to monitor on platinum therapy.   5.  Goals of care: Therapy is curative at this time and aggressive measures are warranted.   6.  Follow-up: He will return in 2 weeks for the start of cycle 2 of therapy.  30  minutes were spent on this encounter.  Time was dedicated to reviewing laboratory data, disease status update and addressing complications noted to cancer and cancer therapy.     Zola Button, MD 10/25/202211:30 AM

## 2021-04-02 NOTE — Progress Notes (Signed)
Ok to treat today per Dr. Hazeline Junker note:  Laboratory data reviewed today showed an adequate counts to proceed with treatment today.  We will adjust gemcitabine dosing given his mild neutropenia and thrombocytopenia.

## 2021-04-15 ENCOUNTER — Other Ambulatory Visit: Payer: Self-pay | Admitting: *Deleted

## 2021-04-15 MED FILL — Dexamethasone Sodium Phosphate Inj 100 MG/10ML: INTRAMUSCULAR | Qty: 1 | Status: AC

## 2021-04-15 MED FILL — Fosaprepitant Dimeglumine For IV Infusion 150 MG (Base Eq): INTRAVENOUS | Qty: 5 | Status: AC

## 2021-04-16 ENCOUNTER — Inpatient Hospital Stay: Payer: Medicare Other | Attending: Oncology

## 2021-04-16 ENCOUNTER — Other Ambulatory Visit: Payer: Self-pay

## 2021-04-16 ENCOUNTER — Inpatient Hospital Stay: Payer: Medicare Other | Admitting: Oncology

## 2021-04-16 ENCOUNTER — Inpatient Hospital Stay: Payer: Medicare Other

## 2021-04-16 VITALS — BP 122/72 | HR 74 | Temp 97.6°F | Resp 17 | Wt 171.4 lb

## 2021-04-16 DIAGNOSIS — Z95828 Presence of other vascular implants and grafts: Secondary | ICD-10-CM

## 2021-04-16 DIAGNOSIS — Z7982 Long term (current) use of aspirin: Secondary | ICD-10-CM | POA: Insufficient documentation

## 2021-04-16 DIAGNOSIS — Z79899 Other long term (current) drug therapy: Secondary | ICD-10-CM | POA: Diagnosis not present

## 2021-04-16 DIAGNOSIS — C679 Malignant neoplasm of bladder, unspecified: Secondary | ICD-10-CM | POA: Diagnosis not present

## 2021-04-16 DIAGNOSIS — Z5111 Encounter for antineoplastic chemotherapy: Secondary | ICD-10-CM | POA: Insufficient documentation

## 2021-04-16 DIAGNOSIS — D709 Neutropenia, unspecified: Secondary | ICD-10-CM | POA: Insufficient documentation

## 2021-04-16 LAB — CBC WITH DIFFERENTIAL (CANCER CENTER ONLY)
Abs Immature Granulocytes: 0.01 10*3/uL (ref 0.00–0.07)
Basophils Absolute: 0 10*3/uL (ref 0.0–0.1)
Basophils Relative: 0 %
Eosinophils Absolute: 0.1 10*3/uL (ref 0.0–0.5)
Eosinophils Relative: 2 %
HCT: 27.6 % — ABNORMAL LOW (ref 39.0–52.0)
Hemoglobin: 9.5 g/dL — ABNORMAL LOW (ref 13.0–17.0)
Immature Granulocytes: 0 %
Lymphocytes Relative: 46 %
Lymphs Abs: 1.4 10*3/uL (ref 0.7–4.0)
MCH: 32.2 pg (ref 26.0–34.0)
MCHC: 34.4 g/dL (ref 30.0–36.0)
MCV: 93.6 fL (ref 80.0–100.0)
Monocytes Absolute: 0.6 10*3/uL (ref 0.1–1.0)
Monocytes Relative: 19 %
Neutro Abs: 1 10*3/uL — ABNORMAL LOW (ref 1.7–7.7)
Neutrophils Relative %: 33 %
Platelet Count: 272 10*3/uL (ref 150–400)
RBC: 2.95 MIL/uL — ABNORMAL LOW (ref 4.22–5.81)
RDW: 12.2 % (ref 11.5–15.5)
WBC Count: 3.1 10*3/uL — ABNORMAL LOW (ref 4.0–10.5)
nRBC: 0 % (ref 0.0–0.2)

## 2021-04-16 LAB — CMP (CANCER CENTER ONLY)
ALT: 22 U/L (ref 0–44)
AST: 25 U/L (ref 15–41)
Albumin: 3.6 g/dL (ref 3.5–5.0)
Alkaline Phosphatase: 83 U/L (ref 38–126)
Anion gap: 9 (ref 5–15)
BUN: 15 mg/dL (ref 8–23)
CO2: 23 mmol/L (ref 22–32)
Calcium: 8.8 mg/dL — ABNORMAL LOW (ref 8.9–10.3)
Chloride: 107 mmol/L (ref 98–111)
Creatinine: 1.5 mg/dL — ABNORMAL HIGH (ref 0.61–1.24)
GFR, Estimated: 47 mL/min — ABNORMAL LOW (ref >60)
Glucose, Bld: 114 mg/dL — ABNORMAL HIGH (ref 70–99)
Potassium: 4.1 mmol/L (ref 3.5–5.1)
Sodium: 139 mmol/L (ref 135–145)
Total Bilirubin: 0.4 mg/dL (ref 0.3–1.2)
Total Protein: 7 g/dL (ref 6.5–8.1)

## 2021-04-16 LAB — MAGNESIUM: Magnesium: 1.9 mg/dL (ref 1.7–2.4)

## 2021-04-16 MED ORDER — SODIUM CHLORIDE 0.9 % IV SOLN: Freq: Once | INTRAVENOUS | Status: AC

## 2021-04-16 MED ORDER — SODIUM CHLORIDE 0.9 % IV SOLN
10.0000 mg | Freq: Once | INTRAVENOUS | Status: AC
  Administered 2021-04-16: 10 mg via INTRAVENOUS
  Filled 2021-04-16: qty 10

## 2021-04-16 MED ORDER — SODIUM CHLORIDE 0.9% FLUSH
10.0000 mL | INTRAVENOUS | Status: DC | PRN
  Administered 2021-04-16: 10 mL

## 2021-04-16 MED ORDER — MAGNESIUM SULFATE 2 GM/50ML IV SOLN
2.0000 g | Freq: Once | INTRAVENOUS | Status: AC
  Administered 2021-04-16: 2 g via INTRAVENOUS
  Filled 2021-04-16: qty 50

## 2021-04-16 MED ORDER — SODIUM CHLORIDE 0.9 % IV SOLN
800.0000 mg/m2 | Freq: Once | INTRAVENOUS | Status: AC
  Administered 2021-04-16: 1558 mg via INTRAVENOUS
  Filled 2021-04-16: qty 40.98

## 2021-04-16 MED ORDER — SODIUM CHLORIDE 0.9 % IV SOLN
35.0000 mg/m2 | Freq: Once | INTRAVENOUS | Status: AC
  Administered 2021-04-16: 68 mg via INTRAVENOUS
  Filled 2021-04-16: qty 68

## 2021-04-16 MED ORDER — PALONOSETRON HCL INJECTION 0.25 MG/5ML
0.2500 mg | Freq: Once | INTRAVENOUS | Status: AC
  Administered 2021-04-16: 0.25 mg via INTRAVENOUS
  Filled 2021-04-16: qty 5

## 2021-04-16 MED ORDER — POTASSIUM CHLORIDE IN NACL 20-0.9 MEQ/L-% IV SOLN
Freq: Once | INTRAVENOUS | Status: AC
  Filled 2021-04-16: qty 1000

## 2021-04-16 MED ORDER — HEPARIN SOD (PORK) LOCK FLUSH 100 UNIT/ML IV SOLN
500.0000 [IU] | Freq: Once | INTRAVENOUS | Status: AC | PRN
  Administered 2021-04-16: 500 [IU]

## 2021-04-16 MED ORDER — SODIUM CHLORIDE 0.9% FLUSH
10.0000 mL | Freq: Once | INTRAVENOUS | Status: AC
  Administered 2021-04-16: 10 mL

## 2021-04-16 MED ORDER — SODIUM CHLORIDE 0.9 % IV SOLN: Freq: Once | INTRAVENOUS | Status: DC

## 2021-04-16 MED ORDER — SODIUM CHLORIDE 0.9 % IV SOLN
150.0000 mg | Freq: Once | INTRAVENOUS | Status: AC
  Administered 2021-04-16: 150 mg via INTRAVENOUS
  Filled 2021-04-16: qty 150

## 2021-04-16 NOTE — Progress Notes (Signed)
OK to proceed with ANC today per MD [facility].   Per MD Alen Blew, ok to proceed with treatment with urine output of 60ml

## 2021-04-16 NOTE — Progress Notes (Signed)
Hematology and Oncology Follow Up Visit  WESSON STITH [id number] 07-08-1940 80 y.o. 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   Prior Therapy:  TURBT on February 08, 2021 and the final pathology showed high-grade urothelial carcinoma with muscle invasion.    Current therapy:  Neoadjuvant chemotherapy utilizing gemcitabine and cisplatin started on March 26, 2021.  He is here for day 1 cycle 2 of therapy.  Interim History: Mr. Kronenberger presents today for a follow-up visit.  Since the last visit, he completed the first cycle of chemotherapy.  He denies any specific complications including no nausea, vomiting or abdominal pain.  He denies any hematochezia, melena or hemoptysis.  He does report of mild hematuria which has resolved.  His performance status quality of life remained excellent.     Medications: Updated on review. Current Outpatient Medications  Medication Sig Dispense Refill   aspirin EC 81 MG tablet Take 81 mg by mouth daily. Swallow whole.     levETIRAcetam (KEPPRA) 500 MG tablet Take 1 tablet (500 mg total) by mouth 2 (two) times daily. 180 tablet 4   lidocaine-prilocaine (EMLA) cream Apply 1 application topically as needed. 30 g 0   Multiple Vitamin (MULTIVITAMIN WITH MINERALS) TABS tablet Take 1 tablet by mouth daily.     prochlorperazine (COMPAZINE) 10 MG tablet Take 1 tablet (10 mg total) by mouth every 6 (six) hours as needed for nausea or vomiting. 30 tablet 0   thiamine 100 MG tablet Take 1 tablet (100 mg total) by mouth daily.     No current facility-administered medications for this visit.     Allergies: No Known Allergies   Physical Exam: Blood pressure 122/72, pulse 74, temperature 97.6 F (36.4 C), temperature source Oral, resp. rate 17, weight 171 lb 6.4 oz (77.7 kg), SpO2 99 %.   ECOG:    General  appearance: Alert, awake without any distress. Head: Atraumatic without abnormalities Oropharynx: Without any thrush or ulcers. Eyes: No scleral icterus. Lymph nodes: No lymphadenopathy noted in the cervical, supraclavicular, or axillary nodes Heart:regular rate and rhythm, without any murmurs or gallops.   Lung: Clear to auscultation without any rhonchi, wheezes or dullness to percussion. Abdomin: Soft, nontender without any shifting dullness or ascites. Musculoskeletal: No clubbing or cyanosis. Neurological: No motor or sensory deficits. Skin: No rashes or lesions.      Lab Results: Lab Results  Component Value Date   WBC 2.9 (L) 04/02/2021   HGB 12.1 (L) 04/02/2021   HCT 35.1 (L) 04/02/2021   MCV 92.4 04/02/2021   PLT 97 (L) 04/02/2021     Chemistry      Component Value Date/Time   NA 134 (L) 04/02/2021 1203   K 4.4 04/02/2021 1203   CL 101 04/02/2021 1203   CO2 22 04/02/2021 1203   BUN 20 04/02/2021 1203   CREATININE 1.37 (H) 04/02/2021 1203      Component Value Date/Time   CALCIUM 9.5 04/02/2021 1203   ALKPHOS 86 04/02/2021 1203   AST 28 04/02/2021 1203   ALT 38 04/02/2021 1203   BILITOT 0.7 04/02/2021 1203       I   Impression and Plan:   80 year old with:  1.  Bladder cancer diagnosed in September 2022.  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  Laboratory data from today reviewed showed adequate hematological parameters.  Plan is to complete 4 cycles of therapy possible before proceeding with radical cystectomy.  He is agreeable to proceed.   Laboratory data from today reviewed and showed borderline neutropenia.  It is likely that he will not receive day 8 of the current cycle.  Growth factor support could also be instituted in the future.   2.  IV  access: Port-A-Cath remains in use without any issues.   3.  Antiemetics: No nausea or vomiting reported at this time.  Compazine is available to him.   4.  Renal function surveillance: Kidney function remained stable with a creatinine clearance close to 50 cc/min.   5.  Goals of care: Aggressive measures are warranted with curative intent.   6.  Follow-up: He will return in 1 week to complete the current cycle in 3 weeks for the beginning of cycle 3.  30  minutes were spent on this visit.  The time was dedicated to reviewing his disease status, treatment choices and addressing complications related to therapy.     Zola Button, MD 11/8/20229:14 AM

## 2021-04-16 NOTE — Patient Instructions (Signed)
[facility]  Discharge Instructions: Thank you for choosing [facility] to provide your oncology and hematology care.   If you have a lab appointment with the Cancer Center, please go directly to the Cancer Center and check in at the registration area.   Wear comfortable clothing and clothing appropriate for easy access to any Portacath or PICC line.   We strive to give you quality time with your provider. You may need to reschedule your appointment if you arrive late (15 or more minutes).  Arriving late affects you and other patients whose appointments are after yours.  Also, if you miss three or more appointments without notifying the office, you may be dismissed from the clinic at the provider's discretion.      For prescription refill requests, have your pharmacy contact our office and allow 72 hours for refills to be completed.    Today you received the following chemotherapy and/or immunotherapy agents Gemzar and Cisplatin       To help prevent nausea and vomiting after your treatment, we encourage you to take your nausea medication as directed.  BELOW ARE SYMPTOMS THAT SHOULD BE REPORTED IMMEDIATELY: *FEVER GREATER THAN 100.4 F (38 C) OR HIGHER *CHILLS OR SWEATING *NAUSEA AND VOMITING THAT IS NOT CONTROLLED WITH YOUR NAUSEA MEDICATION *UNUSUAL SHORTNESS OF BREATH *UNUSUAL BRUISING OR BLEEDING *URINARY PROBLEMS (pain or burning when urinating, or frequent urination) *BOWEL PROBLEMS (unusual diarrhea, constipation, pain near the anus) TENDERNESS IN MOUTH AND THROAT WITH OR WITHOUT PRESENCE OF ULCERS (sore throat, sores in mouth, or a toothache) UNUSUAL RASH, SWELLING OR PAIN  UNUSUAL VAGINAL DISCHARGE OR ITCHING   Items with * indicate a potential emergency and should be followed up as soon as possible or go to the Emergency Department if any problems should occur.  Please show the CHEMOTHERAPY ALERT CARD or IMMUNOTHERAPY ALERT CARD at  check-in to the Emergency Department and triage nurse.  Should you have questions after your visit or need to cancel or reschedule your appointment, please contact [facility]  Dept: [phone]  and follow the prompts.  Office hours are 8:00 a.m. to 4:30 p.m. Monday - Friday. Please note that voicemails left after 4:00 p.m. may not be returned until the following business day.  We are closed weekends and major holidays. You have access to a nurse at all times for urgent questions. Please call the main number to the clinic Dept: [phone] and follow the prompts.   For any non-urgent questions, you may also contact your provider using MyChart. We now offer e-Visits for anyone 18 and older to request care online for non-urgent symptoms. For details visit [email or web address].   Also download the MyChart app! Go to the app store, search "MyChart", open the app, select [facility], and log in with your MyChart username and password.  Due to Covid, a mask is required upon entering the hospital/clinic. If you do not have a mask, one will be given to you upon arrival. For doctor visits, patients may have 1 support person aged 18 or older with them. For treatment visits, patients cannot have anyone with them due to current Covid guidelines and our immunocompromised population.   

## 2021-04-22 ENCOUNTER — Inpatient Hospital Stay (HOSPITAL_COMMUNITY): Payer: Medicare Other

## 2021-04-22 ENCOUNTER — Inpatient Hospital Stay (HOSPITAL_COMMUNITY)
Admission: EM | Admit: 2021-04-22 | Discharge: 2021-05-09 | DRG: 871 | Disposition: E | Payer: Medicare Other | Attending: Pulmonary Disease | Admitting: Pulmonary Disease

## 2021-04-22 ENCOUNTER — Other Ambulatory Visit: Payer: Self-pay

## 2021-04-22 ENCOUNTER — Emergency Department (HOSPITAL_COMMUNITY): Payer: Medicare Other

## 2021-04-22 ENCOUNTER — Encounter (HOSPITAL_COMMUNITY): Payer: Self-pay | Admitting: Emergency Medicine

## 2021-04-22 DIAGNOSIS — R7989 Other specified abnormal findings of blood chemistry: Secondary | ICD-10-CM | POA: Diagnosis present

## 2021-04-22 DIAGNOSIS — N179 Acute kidney failure, unspecified: Secondary | ICD-10-CM | POA: Diagnosis present

## 2021-04-22 DIAGNOSIS — Z95828 Presence of other vascular implants and grafts: Secondary | ICD-10-CM | POA: Diagnosis not present

## 2021-04-22 DIAGNOSIS — I25118 Atherosclerotic heart disease of native coronary artery with other forms of angina pectoris: Secondary | ICD-10-CM | POA: Diagnosis present

## 2021-04-22 DIAGNOSIS — R079 Chest pain, unspecified: Secondary | ICD-10-CM

## 2021-04-22 DIAGNOSIS — Z01818 Encounter for other preprocedural examination: Secondary | ICD-10-CM

## 2021-04-22 DIAGNOSIS — T451X5A Adverse effect of antineoplastic and immunosuppressive drugs, initial encounter: Secondary | ICD-10-CM | POA: Diagnosis present

## 2021-04-22 DIAGNOSIS — R000 Tachycardia, unspecified: Secondary | ICD-10-CM | POA: Diagnosis present

## 2021-04-22 DIAGNOSIS — D709 Neutropenia, unspecified: Secondary | ICD-10-CM | POA: Diagnosis present

## 2021-04-22 DIAGNOSIS — R5081 Fever presenting with conditions classified elsewhere: Secondary | ICD-10-CM | POA: Diagnosis present

## 2021-04-22 DIAGNOSIS — E861 Hypovolemia: Secondary | ICD-10-CM | POA: Diagnosis present

## 2021-04-22 DIAGNOSIS — A419 Sepsis, unspecified organism: Principal | ICD-10-CM | POA: Diagnosis present

## 2021-04-22 DIAGNOSIS — F17210 Nicotine dependence, cigarettes, uncomplicated: Secondary | ICD-10-CM | POA: Diagnosis present

## 2021-04-22 DIAGNOSIS — E872 Acidosis, unspecified: Secondary | ICD-10-CM | POA: Diagnosis present

## 2021-04-22 DIAGNOSIS — Z20822 Contact with and (suspected) exposure to covid-19: Secondary | ICD-10-CM | POA: Diagnosis present

## 2021-04-22 DIAGNOSIS — C679 Malignant neoplasm of bladder, unspecified: Secondary | ICD-10-CM | POA: Diagnosis present

## 2021-04-22 DIAGNOSIS — G40909 Epilepsy, unspecified, not intractable, without status epilepticus: Secondary | ICD-10-CM | POA: Diagnosis present

## 2021-04-22 DIAGNOSIS — J811 Chronic pulmonary edema: Secondary | ICD-10-CM | POA: Diagnosis present

## 2021-04-22 DIAGNOSIS — R6521 Severe sepsis with septic shock: Secondary | ICD-10-CM | POA: Diagnosis present

## 2021-04-22 DIAGNOSIS — I214 Non-ST elevation (NSTEMI) myocardial infarction: Secondary | ICD-10-CM | POA: Diagnosis present

## 2021-04-22 DIAGNOSIS — J9601 Acute respiratory failure with hypoxia: Secondary | ICD-10-CM | POA: Diagnosis present

## 2021-04-22 DIAGNOSIS — Z8673 Personal history of transient ischemic attack (TIA), and cerebral infarction without residual deficits: Secondary | ICD-10-CM

## 2021-04-22 DIAGNOSIS — R6520 Severe sepsis without septic shock: Secondary | ICD-10-CM | POA: Diagnosis present

## 2021-04-22 DIAGNOSIS — D61810 Antineoplastic chemotherapy induced pancytopenia: Secondary | ICD-10-CM | POA: Diagnosis present

## 2021-04-22 DIAGNOSIS — Z9079 Acquired absence of other genital organ(s): Secondary | ICD-10-CM

## 2021-04-22 DIAGNOSIS — I469 Cardiac arrest, cause unspecified: Secondary | ICD-10-CM | POA: Diagnosis not present

## 2021-04-22 DIAGNOSIS — Z8249 Family history of ischemic heart disease and other diseases of the circulatory system: Secondary | ICD-10-CM

## 2021-04-22 DIAGNOSIS — I429 Cardiomyopathy, unspecified: Secondary | ICD-10-CM | POA: Diagnosis present

## 2021-04-22 DIAGNOSIS — F1010 Alcohol abuse, uncomplicated: Secondary | ICD-10-CM | POA: Diagnosis present

## 2021-04-22 DIAGNOSIS — N136 Pyonephrosis: Secondary | ICD-10-CM | POA: Diagnosis present

## 2021-04-22 DIAGNOSIS — R001 Bradycardia, unspecified: Secondary | ICD-10-CM | POA: Diagnosis not present

## 2021-04-22 DIAGNOSIS — R739 Hyperglycemia, unspecified: Secondary | ICD-10-CM | POA: Diagnosis present

## 2021-04-22 DIAGNOSIS — R778 Other specified abnormalities of plasma proteins: Secondary | ICD-10-CM | POA: Diagnosis present

## 2021-04-22 DIAGNOSIS — I4510 Unspecified right bundle-branch block: Secondary | ICD-10-CM | POA: Diagnosis not present

## 2021-04-22 DIAGNOSIS — N1831 Chronic kidney disease, stage 3a: Secondary | ICD-10-CM | POA: Diagnosis present

## 2021-04-22 DIAGNOSIS — I454 Nonspecific intraventricular block: Secondary | ICD-10-CM | POA: Diagnosis present

## 2021-04-22 DIAGNOSIS — R0902 Hypoxemia: Secondary | ICD-10-CM

## 2021-04-22 DIAGNOSIS — Z7982 Long term (current) use of aspirin: Secondary | ICD-10-CM

## 2021-04-22 HISTORY — DX: Cerebral infarction, unspecified: I63.9

## 2021-04-22 HISTORY — DX: Anemia, unspecified: D64.9

## 2021-04-22 HISTORY — DX: Malignant neoplasm of bladder, unspecified: C67.9

## 2021-04-22 LAB — CBC WITH DIFFERENTIAL/PLATELET
Abs Immature Granulocytes: 0 10*3/uL (ref 0.00–0.07)
Abs Immature Granulocytes: 0.05 10*3/uL (ref 0.00–0.07)
Basophils Absolute: 0 10*3/uL (ref 0.0–0.1)
Basophils Absolute: 0 10*3/uL (ref 0.0–0.1)
Basophils Relative: 0 %
Basophils Relative: 0 %
Eosinophils Absolute: 0 10*3/uL (ref 0.0–0.5)
Eosinophils Absolute: 0 10*3/uL (ref 0.0–0.5)
Eosinophils Relative: 0 %
Eosinophils Relative: 0 %
HCT: 34.6 % — ABNORMAL LOW (ref 39.0–52.0)
HCT: 24.3 % — ABNORMAL LOW (ref 39.0–52.0)
Hemoglobin: 11.5 g/dL — ABNORMAL LOW (ref 13.0–17.0)
Hemoglobin: 8.3 g/dL — ABNORMAL LOW (ref 13.0–17.0)
Immature Granulocytes: 0 %
Immature Granulocytes: 8 %
Lymphocytes Relative: 59 %
Lymphocytes Relative: 18 %
Lymphs Abs: 0.3 10*3/uL — ABNORMAL LOW (ref 0.7–4.0)
Lymphs Abs: 0.1 10*3/uL — ABNORMAL LOW (ref 0.7–4.0)
MCH: 31.8 pg (ref 26.0–34.0)
MCH: 32.3 pg (ref 26.0–34.0)
MCHC: 33.2 g/dL (ref 30.0–36.0)
MCHC: 34.2 g/dL (ref 30.0–36.0)
MCV: 95.6 fL (ref 80.0–100.0)
MCV: 94.6 fL (ref 80.0–100.0)
Monocytes Absolute: 0 10*3/uL — ABNORMAL LOW (ref 0.1–1.0)
Monocytes Absolute: 0 10*3/uL — ABNORMAL LOW (ref 0.1–1.0)
Monocytes Relative: 0 %
Monocytes Relative: 2 %
Neutro Abs: 0.2 10*3/uL — CL (ref 1.7–7.7)
Neutro Abs: 0.4 10*3/uL — CL (ref 1.7–7.7)
Neutrophils Relative %: 41 %
Neutrophils Relative %: 72 %
Platelets: 328 10*3/uL (ref 150–400)
Platelets: 149 10*3/uL — ABNORMAL LOW (ref 150–400)
RBC: 3.62 MIL/uL — ABNORMAL LOW (ref 4.22–5.81)
RBC: 2.57 MIL/uL — ABNORMAL LOW (ref 4.22–5.81)
RDW: 13 % (ref 11.5–15.5)
RDW: 12.8 % (ref 11.5–15.5)
WBC: 0.4 10*3/uL — CL (ref 4.0–10.5)
WBC: 0.6 10*3/uL — CL (ref 4.0–10.5)
nRBC: 0 % (ref 0.0–0.2)
nRBC: 0 % (ref 0.0–0.2)

## 2021-04-22 LAB — COMPREHENSIVE METABOLIC PANEL
ALT: 51 U/L — ABNORMAL HIGH (ref 0–44)
AST: 64 U/L — ABNORMAL HIGH (ref 15–41)
Albumin: 2.9 g/dL — ABNORMAL LOW (ref 3.5–5.0)
Alkaline Phosphatase: 85 U/L (ref 38–126)
Anion gap: 9 (ref 5–15)
BUN: 28 mg/dL — ABNORMAL HIGH (ref 8–23)
CO2: 19 mmol/L — ABNORMAL LOW (ref 22–32)
Calcium: 8 mg/dL — ABNORMAL LOW (ref 8.9–10.3)
Chloride: 105 mmol/L (ref 98–111)
Creatinine, Ser: 1.89 mg/dL — ABNORMAL HIGH (ref 0.61–1.24)
GFR, Estimated: 35 mL/min — ABNORMAL LOW (ref >60)
Glucose, Bld: 118 mg/dL — ABNORMAL HIGH (ref 70–99)
Potassium: 4.1 mmol/L (ref 3.5–5.1)
Sodium: 133 mmol/L — ABNORMAL LOW (ref 135–145)
Total Bilirubin: 0.7 mg/dL (ref 0.3–1.2)
Total Protein: 5.6 g/dL — ABNORMAL LOW (ref 6.5–8.1)

## 2021-04-22 LAB — RESP PANEL BY RT-PCR (FLU A&B, COVID) ARPGX2
Influenza A by PCR: NEGATIVE
Influenza B by PCR: NEGATIVE
SARS Coronavirus 2 by RT PCR: NEGATIVE

## 2021-04-22 LAB — TROPONIN I (HIGH SENSITIVITY)
Troponin I (High Sensitivity): 26 ng/L — ABNORMAL HIGH (ref <18)
Troponin I (High Sensitivity): 3467 ng/L (ref <18)
Troponin I (High Sensitivity): 7594 ng/L (ref <18)
Troponin I (High Sensitivity): 8952 ng/L (ref <18)
Troponin I (High Sensitivity): 8948 ng/L (ref <18)
Troponin I (High Sensitivity): 8988 ng/L (ref <18)

## 2021-04-22 LAB — BASIC METABOLIC PANEL
Anion gap: 15 (ref 5–15)
BUN: 29 mg/dL — ABNORMAL HIGH (ref 8–23)
CO2: 17 mmol/L — ABNORMAL LOW (ref 22–32)
Calcium: 9.2 mg/dL (ref 8.9–10.3)
Chloride: 103 mmol/L (ref 98–111)
Creatinine, Ser: 2.01 mg/dL — ABNORMAL HIGH (ref 0.61–1.24)
GFR, Estimated: 33 mL/min — ABNORMAL LOW (ref >60)
Glucose, Bld: 174 mg/dL — ABNORMAL HIGH (ref 70–99)
Potassium: 5 mmol/L (ref 3.5–5.1)
Sodium: 135 mmol/L (ref 135–145)

## 2021-04-22 LAB — PROTIME-INR
INR: 1.2 (ref 0.8–1.2)
Prothrombin Time: 15.1 seconds (ref 11.4–15.2)

## 2021-04-22 LAB — LACTIC ACID, PLASMA
Lactic Acid, Venous: 7.4 mmol/L (ref 0.5–1.9)
Lactic Acid, Venous: 3.2 mmol/L (ref 0.5–1.9)
Lactic Acid, Venous: 1.9 mmol/L (ref 0.5–1.9)

## 2021-04-22 LAB — POCT I-STAT, CHEM 8
BUN: 30 mg/dL — ABNORMAL HIGH (ref 8–23)
Calcium, Ion: 1.09 mmol/L — ABNORMAL LOW (ref 1.15–1.40)
Chloride: 103 mmol/L (ref 98–111)
Creatinine, Ser: 2 mg/dL — ABNORMAL HIGH (ref 0.61–1.24)
Glucose, Bld: 362 mg/dL — ABNORMAL HIGH (ref 70–99)
HCT: 27 % — ABNORMAL LOW (ref 39.0–52.0)
Hemoglobin: 9.2 g/dL — ABNORMAL LOW (ref 13.0–17.0)
Potassium: 4.1 mmol/L (ref 3.5–5.1)
Sodium: 139 mmol/L (ref 135–145)
TCO2: 21 mmol/L — ABNORMAL LOW (ref 22–32)

## 2021-04-22 LAB — URINALYSIS, ROUTINE W REFLEX MICROSCOPIC
Bilirubin Urine: NEGATIVE
Glucose, UA: NEGATIVE mg/dL
Ketones, ur: NEGATIVE mg/dL
Nitrite: NEGATIVE
Protein, ur: 100 mg/dL — AB
RBC / HPF: >50 RBC/hpf — ABNORMAL HIGH (ref 0–5)
Specific Gravity, Urine: 1.02 (ref 1.005–1.030)
WBC, UA: >50 WBC/hpf — ABNORMAL HIGH (ref 0–5)
pH: 7 (ref 5.0–8.0)

## 2021-04-22 LAB — MAGNESIUM
Magnesium: 1.4 mg/dL — ABNORMAL LOW (ref 1.7–2.4)
Magnesium: 1.9 mg/dL (ref 1.7–2.4)

## 2021-04-22 LAB — LIPID PANEL
Cholesterol: 122 mg/dL (ref 0–200)
HDL: 47 mg/dL (ref >40)
LDL Cholesterol: 64 mg/dL (ref 0–99)
Total CHOL/HDL Ratio: 2.6 RATIO
Triglycerides: 56 mg/dL (ref <150)
VLDL: 11 mg/dL (ref 0–40)

## 2021-04-22 LAB — ECHOCARDIOGRAM COMPLETE
Area-P 1/2: 6.32 cm2
Calc EF: 44.5 %
Height: 71 in
MV M vel: 4.6 m/s
MV Peak grad: 84.6 mmHg
Radius: 0.5 cm
S' Lateral: 4.3 cm
Single Plane A2C EF: 40.9 %
Single Plane A4C EF: 49.4 %
Weight: 2740.76 oz

## 2021-04-22 LAB — TSH: TSH: 1.639 u[IU]/mL (ref 0.350–4.500)

## 2021-04-22 LAB — APTT: aPTT: 35 seconds (ref 24–36)

## 2021-04-22 LAB — MRSA NEXT GEN BY PCR, NASAL: MRSA by PCR Next Gen: NOT DETECTED

## 2021-04-22 LAB — HEMOGLOBIN AND HEMATOCRIT, BLOOD
HCT: 31 % — ABNORMAL LOW (ref 39.0–52.0)
Hemoglobin: 9.6 g/dL — ABNORMAL LOW (ref 13.0–17.0)

## 2021-04-22 LAB — D-DIMER, QUANTITATIVE: D-Dimer, Quant: 8.42 ug/mL-FEU — ABNORMAL HIGH (ref 0.00–0.50)

## 2021-04-22 MED ORDER — VANCOMYCIN HCL IN DEXTROSE 1-5 GM/200ML-% IV SOLN: 1000.0000 mg | INTRAVENOUS | Status: DC

## 2021-04-22 MED ORDER — LEVETIRACETAM 500 MG PO TABS
500.0000 mg | ORAL_TABLET | Freq: Two times a day (BID) | ORAL | Status: DC
  Administered 2021-04-22: 500 mg via ORAL
  Filled 2021-04-22: qty 1

## 2021-04-22 MED ORDER — EPINEPHRINE HCL 5 MG/250ML IV SOLN IN NS
INTRAVENOUS | Status: AC
  Filled 2021-04-22: qty 250

## 2021-04-22 MED ORDER — FENTANYL CITRATE (PF) 100 MCG/2ML IJ SOLN: 25.0000 ug | Freq: Once | INTRAMUSCULAR | Status: AC

## 2021-04-22 MED ORDER — VANCOMYCIN HCL 1750 MG/350ML IV SOLN
1750.0000 mg | Freq: Once | INTRAVENOUS | Status: AC
  Administered 2021-04-22: 1750 mg via INTRAVENOUS
  Filled 2021-04-22: qty 350

## 2021-04-22 MED ORDER — AMIODARONE HCL IN DEXTROSE 360-4.14 MG/200ML-% IV SOLN
60.0000 mg/h | INTRAVENOUS | Status: AC
  Administered 2021-04-22: 60 mg/h via INTRAVENOUS
  Filled 2021-04-22 (×2): qty 200

## 2021-04-22 MED ORDER — ACETAMINOPHEN 650 MG RE SUPP: 650.0000 mg | Freq: Four times a day (QID) | RECTAL | Status: DC | PRN

## 2021-04-22 MED ORDER — ASPIRIN EC 81 MG PO TBEC
81.0000 mg | DELAYED_RELEASE_TABLET | Freq: Every day | ORAL | Status: DC
  Administered 2021-04-22: 81 mg via ORAL
  Filled 2021-04-22: qty 1

## 2021-04-22 MED ORDER — THIAMINE HCL 100 MG PO TABS
100.0000 mg | ORAL_TABLET | Freq: Every day | ORAL | Status: DC
  Administered 2021-04-22: 100 mg via ORAL
  Filled 2021-04-22: qty 1

## 2021-04-22 MED ORDER — MAGNESIUM SULFATE 2 GM/50ML IV SOLN
2.0000 g | Freq: Once | INTRAVENOUS | Status: AC
Start: 2021-04-22 — End: 2021-04-22
  Administered 2021-04-22: 2 g via INTRAVENOUS
  Filled 2021-04-22: qty 50

## 2021-04-22 MED ORDER — ORAL CARE MOUTH RINSE
15.0000 mL | Freq: Two times a day (BID) | OROMUCOSAL | Status: DC
  Administered 2021-04-22: 15 mL via OROMUCOSAL

## 2021-04-22 MED ORDER — NOREPINEPHRINE 16 MG/250ML-% IV SOLN
0.0000 ug/min | INTRAVENOUS | Status: DC
  Administered 2021-04-22: 2 ug/min via INTRAVENOUS
  Filled 2021-04-22: qty 250

## 2021-04-22 MED ORDER — ADULT MULTIVITAMIN W/MINERALS CH
1.0000 | ORAL_TABLET | Freq: Every day | ORAL | Status: DC
  Administered 2021-04-22: 1 via ORAL
  Filled 2021-04-22: qty 1

## 2021-04-22 MED ORDER — AMIODARONE HCL IN DEXTROSE 360-4.14 MG/200ML-% IV SOLN
30.0000 mg/h | INTRAVENOUS | Status: DC
  Administered 2021-04-22: 30 mg/h via INTRAVENOUS
  Filled 2021-04-22: qty 200

## 2021-04-22 MED ORDER — SODIUM CHLORIDE 0.9 % IV BOLUS
1000.0000 mL | Freq: Once | INTRAVENOUS | Status: AC
  Administered 2021-04-22: 1000 mL via INTRAVENOUS

## 2021-04-22 MED ORDER — HEPARIN (PORCINE) 25000 UT/250ML-% IV SOLN
950.0000 [IU]/h | INTRAVENOUS | Status: DC
  Administered 2021-04-22: 950 [IU]/h via INTRAVENOUS
  Filled 2021-04-22: qty 250

## 2021-04-22 MED ORDER — SODIUM CHLORIDE 0.9 % IV SOLN: INTRAVENOUS | Status: AC

## 2021-04-22 MED ORDER — FOLIC ACID 1 MG PO TABS
1.0000 mg | ORAL_TABLET | Freq: Every day | ORAL | Status: DC
  Administered 2021-04-22: 1 mg via ORAL
  Filled 2021-04-22: qty 1

## 2021-04-22 MED ORDER — ACETAMINOPHEN 325 MG PO TABS
650.0000 mg | ORAL_TABLET | Freq: Once | ORAL | Status: AC
  Administered 2021-04-22: 650 mg via ORAL
  Filled 2021-04-22: qty 2

## 2021-04-22 MED ORDER — SODIUM CHLORIDE 0.9 % IV SOLN
2.0000 g | Freq: Once | INTRAVENOUS | Status: AC
  Administered 2021-04-22: 2 g via INTRAVENOUS
  Filled 2021-04-22: qty 2

## 2021-04-22 MED ORDER — NOREPINEPHRINE 4 MG/250ML-% IV SOLN: 0.0000 ug/min | INTRAVENOUS | Status: DC

## 2021-04-22 MED ORDER — PHENYLEPHRINE HCL-NACL 20-0.9 MG/250ML-% IV SOLN
0.0000 ug/min | INTRAVENOUS | Status: DC
  Filled 2021-04-22: qty 250

## 2021-04-22 MED ORDER — FENTANYL CITRATE (PF) 100 MCG/2ML IJ SOLN
INTRAMUSCULAR | Status: AC
  Administered 2021-04-22: 25 ug via INTRAVENOUS
  Filled 2021-04-22: qty 2

## 2021-04-22 MED ORDER — NICOTINE 14 MG/24HR TD PT24
14.0000 mg | MEDICATED_PATCH | Freq: Every day | TRANSDERMAL | Status: DC
  Administered 2021-04-22: 14 mg via TRANSDERMAL
  Filled 2021-04-22: qty 1

## 2021-04-22 MED ORDER — LACTATED RINGERS IV BOLUS
1000.0000 mL | Freq: Once | INTRAVENOUS | Status: AC
  Administered 2021-04-22: 1000 mL via INTRAVENOUS

## 2021-04-22 MED ORDER — EPINEPHRINE HCL 5 MG/250ML IV SOLN IN NS: 0.5000 ug/min | INTRAVENOUS | Status: DC

## 2021-04-22 MED ORDER — AMIODARONE LOAD VIA INFUSION
150.0000 mg | Freq: Once | INTRAVENOUS | Status: AC
  Administered 2021-04-22: 150 mg via INTRAVENOUS
  Filled 2021-04-22: qty 83.34

## 2021-04-22 MED ORDER — CHLORHEXIDINE GLUCONATE CLOTH 2 % EX PADS
6.0000 | MEDICATED_PAD | Freq: Every day | CUTANEOUS | Status: DC
  Administered 2021-04-22: 6 via TOPICAL

## 2021-04-22 MED ORDER — AMIODARONE IV BOLUS ONLY 150 MG/100ML
150.0000 mg | Freq: Once | INTRAVENOUS | Status: AC
  Administered 2021-04-22: 150 mg via INTRAVENOUS

## 2021-04-22 MED ORDER — ENOXAPARIN SODIUM 40 MG/0.4ML IJ SOSY
40.0000 mg | PREFILLED_SYRINGE | INTRAMUSCULAR | Status: DC
  Administered 2021-04-22: 40 mg via SUBCUTANEOUS
  Filled 2021-04-22: qty 0.4

## 2021-04-22 MED ORDER — THIAMINE HCL 100 MG/ML IJ SOLN: 100.0000 mg | Freq: Every day | INTRAMUSCULAR | Status: DC

## 2021-04-22 MED ORDER — ROSUVASTATIN CALCIUM 10 MG PO TABS
10.0000 mg | ORAL_TABLET | Freq: Every day | ORAL | Status: DC
  Administered 2021-04-22: 10 mg via ORAL

## 2021-04-22 MED ORDER — LORAZEPAM 1 MG PO TABS: 1.0000 mg | ORAL_TABLET | ORAL | Status: DC | PRN

## 2021-04-22 MED ORDER — ACETAMINOPHEN 325 MG PO TABS: 650.0000 mg | ORAL_TABLET | Freq: Four times a day (QID) | ORAL | Status: DC | PRN

## 2021-04-22 MED ORDER — LORAZEPAM 2 MG/ML IJ SOLN: 1.0000 mg | INTRAMUSCULAR | Status: DC | PRN

## 2021-04-22 MED ORDER — SODIUM CHLORIDE 0.9 % IV BOLUS
500.0000 mL | Freq: Once | INTRAVENOUS | Status: AC
  Administered 2021-04-22: 500 mL via INTRAVENOUS

## 2021-04-22 MED ORDER — NOREPINEPHRINE 4 MG/250ML-% IV SOLN
INTRAVENOUS | Status: AC
  Filled 2021-04-22: qty 250

## 2021-04-22 MED ORDER — SODIUM CHLORIDE 0.9 % IV SOLN: 2.0000 g | Freq: Two times a day (BID) | INTRAVENOUS | Status: DC

## 2021-04-22 MED ORDER — METRONIDAZOLE 500 MG/100ML IV SOLN
500.0000 mg | Freq: Two times a day (BID) | INTRAVENOUS | Status: DC
  Administered 2021-04-22: 500 mg via INTRAVENOUS
  Filled 2021-04-22: qty 100

## 2021-04-22 MED FILL — Medication: Qty: 1 | Status: AC

## 2021-04-23 ENCOUNTER — Ambulatory Visit: Payer: Medicare Other

## 2021-04-23 ENCOUNTER — Other Ambulatory Visit: Payer: Medicare Other

## 2021-04-23 LAB — BLOOD CULTURE ID PANEL (REFLEXED) - BCID2

## 2021-04-23 LAB — URINE CULTURE: Culture: <10000 — AB

## 2021-04-29 LAB — CULTURE, BLOOD (ROUTINE X 2)

## 2021-05-07 ENCOUNTER — Ambulatory Visit: Payer: Medicare Other

## 2021-05-07 ENCOUNTER — Ambulatory Visit: Payer: Medicare Other | Admitting: Physician Assistant

## 2021-05-07 ENCOUNTER — Other Ambulatory Visit: Payer: Medicare Other

## 2021-05-09 NOTE — ED Notes (Signed)
Despite NS bolus, patient still hypotensive. EKG completed per Marlowe Sax, MD order. No other orders at this time.

## 2021-05-09 NOTE — Progress Notes (Signed)
  Echocardiogram 2D Echocardiogram has been performed.  Robert Blackwell May 11, 2021, 8:48 AM

## 2021-05-09 NOTE — ED Notes (Signed)
MD aware of hypotension, no new orders at this time. Waiting to see results of amiodarone.

## 2021-05-09 NOTE — Progress Notes (Signed)
Bilateral soft wrist restraints placed on patient via verbal order from Dr. Silas Flood at bedside. Patient attempting to remove ETT and IVs. Restraints placed at 1540 and removed at 1550 during code event.

## 2021-05-09 NOTE — Consult Note (Addendum)
Cardiology Consultation:   Patient ID: TRELYN VANDERLINDE MRN: [id number]; DOB: Sep 14, 1940  Admit date: 05/22/2021 Date of Consult: 05/22/21  PCP:  Elisabeth Cara, [facility] Cardiologist: New    Patient Profile:   DEJAN ANGERT is a 80 y.o. 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 Illene Silver.  History of Present Illness:   Mr. Ackerley has no prior cardiac history.  He is undergoing chemotherapy.  At baseline he is very active and doing yard work.  Early morning around 1 AM he had sudden onset generalized weakness, fever and chills.  This led to shortness of breath and some heart racing.  Upon arrival to ER, he was noted to be in wide-complex tachycardia with febrile state at a temperature of 100.5.  Hypotensive with systolic blood pressure in the 80s.  Sodium 135.  Potassium 5.0.  Creatinine 2 (baseline 1.3).  Lactic acid 7.4.  Chest x-ray with COPD.  COVID and influenza panel were negative. He was treated with amiodarone bolus with infusion >> conversion to sinus rhythm.  He did felt slowing down of heart rate.  He was treated with broad-spectrum antibiotic and fluids.    Lactic acid improved to 1.9.   Troponin is trending up 26>>3467>>7594 Scr 2.01>>1.89 (Baseline 1.3) Mg 1.4 Elevated LFTs Hgb 11.5>>8.3 Echo being done  Most Recent Blood pressure 82/60  Past Medical History:  Diagnosis Date   Anemia    Bladder cancer ([place])    Seizure ([place])    Stroke [facility])     Past Surgical History:  Procedure Laterality Date   IR IMAGING GUIDED PORT INSERTION  03/13/2021   VASCULAR SURGERY     VEIN SURGERY      Inpatient Medications: Scheduled Meds:  aspirin EC  81 mg Oral Daily   enoxaparin (LOVENOX)  injection  40 mg Subcutaneous X83F   folic acid  1 mg Oral Daily   levETIRAcetam  500 mg Oral BID   multivitamin with minerals  1 tablet Oral Daily   nicotine  14 mg Transdermal Daily   thiamine  100 mg Oral Daily   Or   thiamine  100 mg Intravenous Daily   Continuous Infusions:  sodium chloride Stopped (22-May-2021 0535)   amiodarone     ceFEPime (MAXIPIME) IV     metronidazole Stopped (2021/05/22 0655)   [START ON 04/23/2021] vancomycin     PRN Meds: acetaminophen **OR** acetaminophen, LORazepam **OR** LORazepam  Allergies:   No Known Allergies  Social History:   Social History   Socioeconomic History   Marital status: Married    Spouse name: Not on file   Number of children: 3   Years of education: two years college   Highest education level: Not on file  Occupational History   Occupation: Retired  Tobacco Use   Smoking status: Every Day    Packs/day: 0.75    Types: Cigarettes   Smokeless tobacco: Never  Substance and Sexual Activity   Alcohol use: Yes    Comment: 3 beers daily   Drug use: Never   Sexual activity: Not on file  Other Topics Concern   Not on file  Social History Narrative   Lives at home with his wife.   Right-handed.  4 cups caffeine per day.   Social Determinants of Health   Financial Resource Strain: Not on file  Food Insecurity: Not on file  Transportation Needs: Not on file  Physical Activity: Not on file  Stress: Not on file  Social Connections: Not on file  Intimate Partner Violence: Not on file    Family History:   Family History  Problem Relation Age of Onset   Heart disease Mother    Heart disease Father    Seizures Neg Hx      ROS:  Please see the history of present illness.  All other ROS reviewed and negative.     Physical Exam/Data:   Vitals:   2021/05/05 0650 05-05-21 0700 2021/05/05 0710 05-05-21 0800  BP: (!) 86/59 (!) 80/59 (!) 87/60 (!) 82/60  Pulse: 99 98 97 (!) 101  Resp: (!) 21 (!) 24 (!) 22 (!) 24  Temp:       TempSrc:      SpO2: 99% 97% 98% 97%  Weight:      Height:        Intake/Output Summary (Last 24 hours) at 05-05-21 0909 Last data filed at 05-May-2021 0649 Gross per 24 hour  Intake 549.01 ml  Output --  Net 549.01 ml   Last 3 Weights 05/05/2021 04/16/2021 04/02/2021  Weight (lbs) 171 lb 4.8 oz 171 lb 6.4 oz 173 lb  Weight (kg) 77.7 kg 77.747 kg 78.472 kg     Body mass index is 23.89 kg/m.  General:  Well nourished, well developed, in no acute distress HEENT: normal Neck: no JVD Vascular: No carotid bruits; Distal pulses 2+ bilaterally Cardiac:  normal S1, S2; RRR; no murmur  Lungs:  clear to auscultation bilaterally, no wheezing, rhonchi or rales  Abd: soft, nontender, no hepatomegaly  Ext: no edema Musculoskeletal:  No deformities, BUE and BLE strength normal and equal Skin: warm and dry  Neuro:  CNs 2-12 intact, no focal abnormalities noted Psych:  Normal affect   EKG:  The EKG was personally reviewed and demonstrates:  Sinus rhythm, PAC Telemetry:  Telemetry was personally reviewed and demonstrates:  Sinus rhythm, PVC, previous WCT  Relevant CV Studies:  Echo 02/2020 1. Left ventricular ejection fraction, by estimation, is 55 to 60%. The  left ventricle has normal function. The left ventricle has no regional  wall motion abnormalities. There is mild left ventricular hypertrophy.  Left ventricular diastolic parameters  were normal.   2. Right ventricular systolic function is normal. The right ventricular  size is mildly enlarged. There is normal pulmonary artery systolic  pressure. The estimated right ventricular systolic pressure is 82.9 mmHg.   3. Left atrial size was mildly dilated.   4. The mitral valve is normal in structure. Trivial mitral valve  regurgitation.   5. The aortic valve was not well visualized. Aortic valve regurgitation  is not visualized. No aortic stenosis is present.   6. The inferior vena cava is dilated in size with >50% respiratory   variability, suggesting right atrial pressure of 8 mmHg.   Laboratory Data:  High Sensitivity Troponin:   Recent Labs  Lab May 05, 2021 0210 05-05-21 0438 05-05-21 0700  TROPONINIHS 26* 3,467* 7,594*     Chemistry Recent Labs  Lab 04/16/21 0905 2021/05/05 0210 05/05/21 0438  NA 139 135 133*  K 4.1 5.0 4.1  CL 107 103 105  CO2 23 17* 19*  GLUCOSE 114* 174* 118*  BUN 15 29* 28*  CREATININE 1.50* 2.01* 1.89*  CALCIUM 8.8*  9.2 8.0*  MG 1.9  --  1.4*  GFRNONAA 47* 33* 35*  ANIONGAP 9 15 9     Recent Labs  Lab 04/16/21 0905 May 09, 2021 0438  PROT 7.0 5.6*  ALBUMIN 3.6 2.9*  AST 25 64*  ALT 22 51*  ALKPHOS 83 85  BILITOT 0.4 0.7   Hematology Recent Labs  Lab 04/16/21 0905 05-09-21 0210 05/09/2021 0438  WBC 3.1* 0.4* 0.6*  RBC 2.95* 3.62* 2.57*  HGB 9.5* 11.5* 8.3*  HCT 27.6* 34.6* 24.3*  MCV 93.6 95.6 94.6  MCH 32.2 31.8 32.3  MCHC 34.4 33.2 34.2  RDW 12.2 13.0 12.8  PLT 272 328 149*   Thyroid  Recent Labs  Lab May 09, 2021 0213  TSH 1.639    Radiology/Studies:  DG Chest Port 1 View  Result Date: 09-May-2021 CLINICAL DATA:  Dyspnea, chills, fever EXAM: PORTABLE CHEST 1 VIEW COMPARISON:  02/11/2020 FINDINGS: The lungs are hyperinflated in keeping with changes of underlying COPD, unchanged from prior examination. The lungs are clear. No pneumothorax or pleural effusion. Cardiac size within normal limits. Right internal jugular chest port has been place with its tip within the superior cavoatrial junction. No acute bone abnormality. IMPRESSION: Interval chest port placement, tip at the superior cavoatrial junction. No pneumothorax. COPD. No radiographic evidence of acute cardiopulmonary disease. Electronically Signed   By: Fidela Salisbury M.D.   On: May 09, 2021 02:37     Assessment and Plan:   Wide-complex tachycardia Non-STEMI  Patient was noted in wide-complex tachycardia with some BBB physiology upon arrival in setting of septic shock (unknown source),  hypomagnesemia and neutropenic fever.  He was treated with IV amiodarone bolus and infusion with conversion to sinus rhythm.  He did felt some palpitation and shortness of breath with elevated heart rate.  No chest pain.   - Currently maintaining sinus rhythm. -Troponin is trending up 26>>3467>>7594.  -Patient is active at baseline however he certainly could have underlying heart disease given longstanding history of tobacco smoking. - Echocardiogram done, pending reading - Keep Mg > 2 and K > 4 - Continue ASA 81mg  qd - start Low dose statin >> watch LFTs - Start heparin without bolus >> watch platelets and hgb  3. Septic shock/neutropenic fever - Treated with fluids and abx -last temp was normal - Lactic acid improved to 1.9 - SBP remain soft  4. Acute on CKD IIIa - Scr 2.0>>1.8 (baseline 1.3)  5. Anemia - Hgb 11.5>>8.3 (likely dilutional)  6. Alcohol abuse - On CIWA protocol  7.Tobacco abuse - Cessation recommended   8. Elevated D-dimer - Per primary team   Dr. Johney Frame to see.   Risk Assessment/Risk Scores:  { TIMI Risk Score for Unstable Angina or Non-ST Elevation MI:   The patient's TIMI risk score is 2, which indicates a 8% risk of all cause mortality, new or recurrent myocardial infarction or need for urgent revascularization in the next 14 days.{  For questions or updates, please contact [place] Please consult [email or web address] for contact info under   Jarrett Soho, [place]  05/09/2021 9:09 AM   Patient seen and examined and agree with Robbie Lis, PA as detailed above.  In brief, the patient is a 80 year old male with recently diagnosed T2N0 high grade urothelial carcinoma of the bladder s/p TURP currently undergoing chemo, prior CVA, PAD, and long standing tobacco and alcohol abuse who presented with sudden onset generalized weakness, fevers with concern for sepsis. Course complicated by a wide-complex tachycardia with HR 150 and  rising troponin   26>>3467>>7594 for which cardiology has been consulted.   TTE 2021-04-24 with LVEF 40-45%, with severe hypokinesis of the basal inferior wall, moderate LVH, G2DD, normal RV, moderate-to-severe MR.  Given rising troponin, depressed EF with wall motion abnormalities and wide complex tachycardia on arrival, there is high concern symptomatic CAD/NSTEMI in the setting of septic shock. Fortunately, the patient is currently chest pain free and has converted back to NSR on ECG without acute STE/STD. Will continue aggressive medical management at this time while patient stabilizes from an infectious standpoint and renal function improves prior to proceeding with coronary angiography.   GEN: No acute distress.   Neck: No JVD. Port in right chest Cardiac: RRR, no murmurs, rubs, or gallops.  Respiratory: Clear to auscultation bilaterally. GI: Soft, nontender, non-distended  MS: No edema; No deformity. Neuro:  Nonfocal  Psych: Normal affect    Plan: -Will plan for coronary angiography once more clinically stable from an infectious and renal function standpoint -Start heparin gtt without a bolus given concern for ACS; trend hemoglobin closely; no signs of active bleeding currently -Management of neutropenic fever/septic shock per primary team -Follow-up V/Q scan; RV with normal size and systolic function on TTE -Continue ASA 81mg  daily -Start crestor 10mg  daily; lipid panel added on to labs -Continue amiodarone gtt for now; can transition to PO tomorrow -No BB due to soft blood pressures on arrival; add as able -No ACE/ARB due to AKI; will add as able -Will benefit from spiro/SGLT2i given reduced EF prior to discharge -Will need vascular ultrasounds with ABIs for claudication symptoms given known PAD as out-patient  Gwyndolyn Kaufman, MD

## 2021-05-09 NOTE — H&P (Signed)
History and Physical    Robert Blackwell MOQ:[id number] DOB: [birth year] DOA: 2021/05/01  PCP: Elisabeth Cara, PA-C Patient coming from: Home  Chief Complaint: Fever  HPI: Robert Blackwell is a 80 y.o. 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  Febrile on arrival to the ED with temperature 100.5 F. Tachycardic to the 150s with monomorphic wide-complex rhythm concerning for V. tach.  Hypotensive with systolic in the 35W.  Labs showing WBC 0.4 (ANC 0.2), hemoglobin 11.5 (stable), platelet count 328k.  Sodium 135, potassium 5.0, chloride 103, bicarb 17, anion gap 15, BUN 29, creatinine 2.0 (baseline 1.3), glucose 174.  COVID and influenza PCR negative.  High-sensitivity troponin 26.  Blood cultures drawn.  Lactic acid 7.4.  UA and urine culture pending.  Chest x-ray showing findings consistent with COPD; no acute abnormality. He was given vancomycin, cefepime, Tylenol, and 2 L fluid boluses.  Blood pressure improved.  Converted to sinus rhythm after amiodarone bolus and infusion.  Repeat EKG showing slight ST depressions in inferior and lateral leads.  Patient states his last chemo session was a week ago.  He was doing fine until tonight when he woke up from his sleep with fevers and chills.  Wife states patient woke up with severe chills to the point where it was hard for her to put a thermometer in his mouth because he was shaking so much.  His temperature was 99 F.  Patient states he had a bowel movement and his stool was loose tonight but was not having any diarrhea previously.  He felt short of breath when his symptoms started tonight but feeling better now.  No cough or chest pain.  Endorsing dysuria.  No nausea, vomiting, or abdominal pain.  No headaches or  neck pain.  No dizziness.  Review of Systems:  All systems reviewed and apart from history of presenting illness, are negative.  Past Medical History:  Diagnosis Date   Anemia    Bladder cancer ([place])    Seizure ([place] [facility])    Stroke [facility])     Past Surgical History:  Procedure Laterality Date   IR IMAGING GUIDED PORT INSERTION  03/13/2021   VASCULAR SURGERY     VEIN SURGERY       reports that he has been smoking cigarettes. He has been smoking an average of .75 packs per day. He has never used smokeless tobacco. He reports current alcohol use. He reports that he does not use drugs.  No Known Allergies  Family History  Problem Relation Age of Onset   Heart disease Mother    Heart disease Father    Seizures Neg Hx     Prior to Admission medications   Medication Sig Start Date End Date Taking? Authorizing Provider  aspirin EC 81 MG tablet Take 81 mg by mouth daily. Swallow whole.   Yes [provider]  levETIRAcetam (KEPPRA) 500 MG tablet Take 1 tablet (500 mg total) by mouth 2 (two) times daily. 08/15/20 11/08/21 Yes Suzzanne Cloud, NP  lidocaine-prilocaine (EMLA) cream Apply 1 application topically as needed. Patient taking differently: Apply 1 application topically as needed (for port access). 03/07/21  Yes Wyatt Portela, MD  Multiple Vitamin (MULTIVITAMIN WITH MINERALS) TABS tablet Take 1 tablet by mouth daily.   Yes [provider]  thiamine 100 MG tablet Take 1 tablet (100 mg total) by mouth daily. 02/14/20  Yes Mariel Aloe, MD  prochlorperazine (COMPAZINE) 10 MG tablet Take 1 tablet (10 mg total) by mouth every 6 (six) hours as needed for nausea or vomiting. Patient not taking: No sig reported 03/07/21   Wyatt Portela, MD    Physical Exam: Vitals:   04/30/2021 0330 2021-04-30 0345 Apr 30, 2021 0400 04-30-21 0415  BP: 105/73 108/68 101/65 95/65  Pulse: (!) 105 (!) 104 (!) 106 (!) 105  Resp: (!) 25 (!) 22 (!) 23 (!) 22  Temp:      TempSrc:      SpO2: 95% 95% 91%  93%  Weight:      Height:        Physical Exam Constitutional:      General: He is not in acute distress. HENT:     Head: Normocephalic and atraumatic.  Eyes:     Extraocular Movements: Extraocular movements intact.     Conjunctiva/sclera: Conjunctivae normal.  Cardiovascular:     Rate and Rhythm: Regular rhythm. Tachycardia present.     Pulses: Normal pulses.     Comments: Mildly tachycardic Pulmonary:     Effort: Pulmonary effort is normal. No respiratory distress.     Breath sounds: Normal breath sounds. No wheezing or rales.  Abdominal:     General: Bowel sounds are normal.     Palpations: Abdomen is soft.     Tenderness: There is no abdominal tenderness. There is no guarding.  Musculoskeletal:        General: No swelling or tenderness.     Cervical back: Normal range of motion and neck supple.  Skin:    General: Skin is warm and dry.  Neurological:     General: No focal deficit present.     Mental Status: He is alert and oriented to person, place, and time.     Labs on Admission: I have personally reviewed following labs and imaging studies  CBC: Recent Labs  Lab 04/16/21 0905 2021/04/30 0210 04-30-21 0438  WBC 3.1* 0.4* 0.6*  NEUTROABS 1.0* 0.2* PENDING  HGB 9.5* 11.5* 8.3*  HCT 27.6* 34.6* 24.3*  MCV 93.6 95.6 94.6  PLT 272 328 500*   Basic Metabolic Panel: Recent Labs  Lab 04/16/21 0905 Apr 30, 2021 0210  NA 139 135  K 4.1 5.0  CL 107 103  CO2 23 17*  GLUCOSE 114* 174*  BUN 15 29*  CREATININE 1.50* 2.01*  CALCIUM 8.8* 9.2  MG 1.9  --    GFR: Estimated Creatinine Clearance: 31.2 mL/min (A) (by C-G formula based on SCr of 2.01 mg/dL (H)). Liver Function Tests: Recent Labs  Lab 04/16/21 0905  AST 25  ALT 22  ALKPHOS 83  BILITOT 0.4  PROT 7.0  ALBUMIN 3.6   No results for input(s): LIPASE, AMYLASE in the last 168 hours. No results for input(s): AMMONIA in the last 168 hours. Coagulation Profile: No results for input(s): INR, PROTIME in the  last 168 hours. Cardiac Enzymes: No results for input(s): CKTOTAL, CKMB, CKMBINDEX, TROPONINI in the last 168 hours. BNP (last 3 results) No results for input(s): PROBNP in the last 8760 hours. HbA1C: No results for input(s): HGBA1C in the last 72 hours. CBG: No results for input(s): GLUCAP in the last 168 hours. Lipid Profile: No results for input(s): CHOL, HDL, LDLCALC, TRIG, CHOLHDL, LDLDIRECT in the last 72 hours. Thyroid Function Tests: No results for input(s): TSH, T4TOTAL, FREET4, T3FREE,  THYROIDAB in the last 72 hours. Anemia Panel: No results for input(s): VITAMINB12, FOLATE, FERRITIN, TIBC, IRON, RETICCTPCT in the last 72 hours. Urine analysis:    Component Value Date/Time   COLORURINE YELLOW 02/11/2020 0611   APPEARANCEUR CLEAR 02/11/2020 0611   LABSPEC 1.014 02/11/2020 0611   PHURINE 5.0 02/11/2020 0611   GLUCOSEU NEGATIVE 02/11/2020 0611   HGBUR SMALL (A) 02/11/2020 0611   BILIRUBINUR NEGATIVE 02/11/2020 0611   KETONESUR NEGATIVE 02/11/2020 0611   PROTEINUR NEGATIVE 02/11/2020 0611   NITRITE POSITIVE (A) 02/11/2020 0611   LEUKOCYTESUR MODERATE (A) 02/11/2020 0611    Radiological Exams on Admission: DG Chest Port 1 View  Result Date: 2021-04-24 CLINICAL DATA:  Dyspnea, chills, fever EXAM: PORTABLE CHEST 1 VIEW COMPARISON:  02/11/2020 FINDINGS: The lungs are hyperinflated in keeping with changes of underlying COPD, unchanged from prior examination. The lungs are clear. No pneumothorax or pleural effusion. Cardiac size within normal limits. Right internal jugular chest port has been place with its tip within the superior cavoatrial junction. No acute bone abnormality. IMPRESSION: Interval chest port placement, tip at the superior cavoatrial junction. No pneumothorax. COPD. No radiographic evidence of acute cardiopulmonary disease. Electronically Signed   By: Fidela Salisbury M.D.   On: 04-24-21 02:37    Assessment/Plan Principal Problem:   Neutropenic fever  (HCC) Active Problems:   AKI (acute kidney injury) ([place])   Severe sepsis (HCC)   Wide-complex tachycardia   Elevated troponin   Neutropenic fever Septic shock Patient febrile, tachycardic, and hypotensive.  Labs showing severe neutropenia and significant lactic acidosis.  History of bladder cancer on chemo.  Has a Port-A-Cath which is a potential source of infection.  Also endorsing dysuria, UA currently pending.  Chest x-ray not suggestive of pneumonia.  COVID and influenza PCR negative.  No meningeal signs.  Patient was given vancomycin and cefepime in the ED.  He continues to be hypotensive with systolic in the 25K to 53Z despite aggressive fluid resuscitation.  Received 3 L fluid boluses so far.  Not symptomatic from his hypotension.  Lactic acidosis improving (7.4 >3.2). Plan: Continue vancomycin and cefepime.  Add Flagyl.  Additional fluid bolus ordered and PCCM consulted given persistent hypotension.  May need vasopressors if he continues to be hypotensive.  UA and urine culture pending.  Blood culture x2 pending.  Trend lactate and WBC count.  Please consult oncology in the morning.  Wide-complex tachycardia Hypomagnesemia Elevated troponin Initially in the ED patient was tachycardic to the 150s with monomorphic wide-complex rhythm concerning for V. tach.  Likely precipitated by infection and hypomagnesemia (magnesium 1.4). Converted to sinus rhythm after amiodarone bolus and infusion.  Repeat EKG showing slight ST depressions in inferior and lateral leads.  Initial high-sensitivity troponin was 26 but repeat came back significantly elevated at 3467.  No documented history of CAD.  Patient denies any chest pain and appears comfortable.  Discussed with cardiology, troponin elevation felt to be due to demand ischemia rather than ACS.  Cardiology team will consult. -Continue amiodarone.  Replace magnesium.  Continue to trend troponin.  Stat echocardiogram ordered.  Acute hypoxemic respiratory  failure Oxygen saturation noted to be as low as upper 80s, improved to mid 90s with 2 L supplemental oxygen.  No respiratory distress.  He received fluid boluses but has no rales on exam.  Prior echo from September 2021 showing EF 55 to 60% and normal diastolic parameters.  Chest x-ray done initially negative for any acute finding. -Given elevated troponin, stat echocardiogram ordered  to assess for wall motion abnormalities.  Also, repeat chest x-ray ordered to assess for possible pulmonary edema.  PE is also on the differential given V. tach at presentation. Will avoid CT angiogram at this time given AKI/low GFR.  Stat D-dimer ordered.  AKI on CKD stage IIIa Suspect AKI is due to prerenal azotemia in the setting of septic shock/hypotension. BUN 29, creatinine 2.0 (baseline 1.3).  Creatinine improved to 1.8 with IV fluid resuscitation. -Continue IV fluid hydration.  Monitor renal function and urine output.  Avoid nephrotoxic agents/contrast.  Metabolic acidosis Likely multifactorial from AKI and lactic acidosis.  Bicarb 17, anion gap 15 on initial labs.  After IV fluid resuscitation, bicarb improved to 19, anion gap 9. -IV fluid hydration, continue to monitor  Normocytic anemia Hemoglobin stable on initial labs, 11.5.  On repeat labs after fluid boluses, hemoglobin dropped to 8.3 which is likely is due to hemodilution as platelet count is also lower.  No signs of active bleeding. -Continue to monitor  Mildly elevated transaminases Likely due to septic shock.  AST 64 and ALT 51.  Alk phos and T bili normal. 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 -Please consult oncology in the morning.  Seizure disorder -Continue Keppra  History of CVA -Continue aspirin.  He is not on a statin and liver enzymes elevated at this time.  Alcohol use Drinks multiple beers daily and at risk for withdrawal during  this hospitalization. -CIWA protocol; Ativan as needed.  Thiamine, folate, and multivitamin.  Tobacco use -NicoDerm patch and counseling  DVT prophylaxis: Lovenox Code Status: Patient wishes to be full code. Family Communication: Wife at bedside. Disposition Plan: Status is: Inpatient  Remains inpatient appropriate because: Neutropenic fever/severe sepsis  Level of care: Level of care: Stepdown  The patient is critically ill with febrile neutropenia, septic shock, and multiorgan failure.  The medical decision making on this patient was of high complexity and the patient is at very high risk for clinical deterioration.  Time spent: 120 minutes  Shela Leff MD [facility]  If 7PM-7AM, please contact night-coverage [email or web address]  05/13/2021, 5:00 AM

## 2021-05-09 NOTE — Progress Notes (Signed)
Pharmacy Antibiotic Note  Robert Blackwell is a 80 y.o. male admitted on 05-14-21 with  febrile neutropenia .  Pharmacy has been consulted for Cefepime + Vancomycin dosing.  Plan: Cefepime 2gm IV q12h Vancomycin 1gm IV q24h to target AUC 400-550 Check Vancomycin levels at steady state Monitor renal function and cx data   Height: 5\' 11"  (180.3 cm) Weight: 77.7 kg (171 lb 4.8 oz) IBW/kg (Calculated) : 75.3  Temp (24hrs), Avg:99.3 F (37.4 C), Min:98.1 F (36.7 C), Max:100.5 F (38.1 C)  Recent Labs  Lab 04/16/21 0905 May 14, 2021 0210 May 14, 2021 0220 May 14, 2021 0438  WBC 3.1* 0.4*  --  0.6*  CREATININE 1.50* 2.01*  --  1.89*  LATICACIDVEN  --   --  7.4* 3.2*    Estimated Creatinine Clearance: 33.2 mL/min (A) (by C-G formula based on SCr of 1.89 mg/dL (H)).    No Known Allergies  Antimicrobials this admission: 11/14 Vancomycin >>  11/14 Cefepime >>   Dose adjustments this admission:  Microbiology results: 11/14 BCx:   Thank you for allowing pharmacy to be a part of this patient's care.  Netta Cedars PharmD 2021/05/14 7:07 AM

## 2021-05-09 NOTE — ED Triage Notes (Signed)
Pt reports with SHOB, chills, and fever since last night. Pt is undergoing chemo treatment.

## 2021-05-09 NOTE — Death Summary Note (Addendum)
DEATH SUMMARY   Patient Details  Name: Robert Blackwell MRN: [id number] DOB: 08-13-1940  Admission/Discharge Information   Admit Date:  04-27-2021  Date of Death:   04/27/21  Time of Death:    Length of Stay: 0  Referring Physician: Elisabeth Cara, PA-C   Reason(s) for Hospitalization  Shortness of breath, hypoxemic respiratory: NSTEMI, septic shock  Diagnoses  Preliminary cause of death:  Secondary Diagnoses (including complications and co-morbidities):  Principal Problem:   Septic shock ([name]) Active Problems:   AKI (acute kidney injury) ([place])   Severe sepsis ([place])   Neutropenic fever ([place])   Wide-complex tachycardia   Elevated troponin   NSTEMI (non-ST elevated myocardial infarction) ([place])   Cardiomyopathy ([place])   Cardiac arrest, cause unspecified [facility])   [place] [facility] Course (including significant findings, care, treatment, and services provided and events leading to death)  Robert Blackwell is a 80 y.o. year old male who has a history of recently diagnosed bladder cancer status post recent initiation of chemotherapy who presents with shortness of breath.  Chest x-ray clear.  Noted to be in wide-complex tachycardia.  Given amiodarone.  This resolved.  Shortness of breath improved.  Chest pain.  Noted to be hypotensive.  Received 2 L IV fluid bolus.  Blood pressures initially improved.  Remain borderline.  Was brought to the ICU and started on norepinephrine.  Troponins trended and rose to about 9000.  Cardiology was consulted.  Placed on heparin drip.  TTE obtained.  New reduced EF, 45%, severe MR, dilated LA, grade 2 diastolic dysfunction, RV function normal, RV size normal.  Cardiomyopathy new since 2021.  He again went into wide-complex tachycardia.  Complained of shortness of breath.  Denies chest pain.  Amnio bolus given.  Heart rate improved to 90s.  Was 130s.  Case again discussed with cardiology.  Agreed with plan.  Shortly thereafter, CODE BLUE.  PEA arrest.  He was  unresponsive, agonal, no pulse.  CPR for 12 minutes.  4 rounds of epinephrine.  ROSC was obtained.  Noted to be bradycardic in the 40s.  EKG with wide-complex slow bradycardia, possible right bundle branch block.  He developed profound hypotension.  Escalate to 3 pressors.  Despite this continue to be hypotensive.  PEA arrest again.  Unfortunate, ROSC not obtained.  Time of death 5 on 27-Apr-2021.  Informed family.  MI/cardiac cause suspected.  No autopsy pursued.    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 EXAM: NUCLEAR MEDICINE PET SKULL BASE TO THIGH TECHNIQUE: 8.3 mCi F-18 FDG was injected intravenously. Full-ring PET imaging was performed from the skull base to thigh after the radiotracer. CT data was obtained and used for attenuation correction and anatomic localization. Fasting blood glucose: 116 mg/dl COMPARISON:  01/25/2021 FINDINGS: Mediastinal blood pool activity: SUV max 2.4 Liver activity: SUV max NA NECK: No significant abnormal hypermetabolic activity in this region. Incidental CT findings: Bilateral common carotid atherosclerotic calcification. CHEST: No significant abnormal hypermetabolic activity in this region. Incidental CT findings: Coronary, aortic arch, and branch vessel atherosclerotic vascular disease. Right Port-A-Cath tip: Cavoatrial junction. Fatty prominence of the interatrial septum. Mild biapical pleuroparenchymal scarring without accentuated metabolic activity. Centrilobular emphysema. ABDOMEN/PELVIS: There is mildly accentuated activity in the tissue along the right posterior urinary bladder which is difficult to separate out from activity  in the adjacent pool of excreted FDG in the urinary bladder, but with a maximum SUV of about 6.6. This extends  down towards the margin of the right prostate gland and seminal vesicles. Small retroperitoneal lymph nodes in the upper abdomen are not appreciably hypermetabolic. The faint 1.4 cm hypodense subcapsular lesion in the right hepatic lobe on image 113 of series 4 does not exhibit perceived hypermetabolic activity accordingly is unlikely to be a metastatic lesion, although surveillance is likely still warranted. Incidental CT findings: Stable appearance of right renal atrophy with right hydronephrosis and hydroureter extending down to the tumor along the right UVJ. Extensive atherosclerosis is present, including aortoiliac atherosclerotic disease. Infrarenal abdominal aortic aneurysm 3.0 cm in diameter. Sigmoid colon diverticulosis. SKELETON: No significant abnormal hypermetabolic activity in this region. 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 The lack of hypermetabolic activity makes this highly unusual to be a metastatic lesion. Similarly the small hypodensity along the inferior endplate of L4 is most likely a Schmorl's node given the location and appearance IMPRESSION: 1. Moderate activity associated with the right posterior urinary bladder tumor, a representative portion had a maximum SUV of 6.6. 2. No findings of hypermetabolic activity or hypermetabolic metastatic disease. 3. Regarding the liver lesion seen on prior CT, this 1.4 cm hypodense lesion in the subcapsular right hepatic lobe does not have perceived hypermetabolic activity greater than the liver. This is unlikely to be a metastatic lesion, although surveillance is still warranted given the nonspecific nature of the lesion. 4. 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  Given the likelihood of benign etiology I would suggest surveillance in the context of the patient's anticipated cancer follow up imaging. 5. Extensive atherosclerosis along with a 3.0 cm in diameter infrarenal abdominal aortic aneurysm. Recommend follow-up ultrasound every 3 years, although this could also be adequately followed in the context of the patient's anticipated cancer follow up imaging. This recommendation follows ACR consensus guidelines: White Paper of the ACR Incidental Findings Committee II on Vascular Findings. J Am Coll Radiol 2013; 10:789-794. Aortic aneurysm NOS (ICD10-I71.9). 6. Other imaging findings of potential clinical significance: Aortic Atherosclerosis (ICD10-I70.0) and Emphysema (ICD10-J43.9). Fatty prominence of the interatrial septum. Stable right renal atrophy with right hydronephrosis and hydroureter. Sigmoid colon diverticulosis. Small suspected Schmorl's node along the inferior endplate of L4. Electronically Signed   By: Van Clines M.D.   On: 03/26/2021 10:32   DG CHEST PORT 1 VIEW  Result Date: 05-13-21 CLINICAL DATA:  Intubation post code blue.  Enteric tube in place. EXAM: PORTABLE CHEST 1 VIEW COMPARISON:  Radiographs earlier the same date and 02/11/2020. PET-CT 03/25/2021. FINDINGS: 1508 hours. Interval intubation with tip of the endotracheal tube in the midtrachea. An enteric tube has been placed, projecting below the diaphragm, tip not visualized. Right IJ Port-A-Cath is unchanged with the tip near the superior cavoatrial junction. External pacing pad in place. The heart size and mediastinal contours are stable with aortic atherosclerosis. There are new perihilar airspace opacities bilaterally which may reflect edema or aspiration. No pneumothorax, significant pleural effusion or definite rib fracture identified. IMPRESSION: 1. Satisfactory position of the endotracheal and enteric tubes. The tip of the latter is not visualized. 2. New diffuse bilateral  airspace opacities which may reflect edema or aspiration. No evidence of pneumothorax.  Electronically Signed   By: Richardean Sale M.D.   On: May 18, 2021 15:33   DG CHEST PORT 1 VIEW  Result Date: May 18, 2021 CLINICAL DATA:  Fever, shortness of breath EXAM: PORTABLE CHEST 1 VIEW COMPARISON:  2021-05-18 at 0220 hours FINDINGS: Right IJ approach Port-A-Cath remains in place with distal tip terminating at the superior cavoatrial junction. Stable cardiomediastinal contours. No focal airspace consolidation, pleural effusion, or pneumothorax. IMPRESSION: No acute cardiopulmonary findings. Electronically Signed   By: Davina Poke D.O.   On: 05-18-21 09:09   DG Chest Port 1 View  Result Date: 05/18/2021 CLINICAL DATA:  Dyspnea, chills, fever EXAM: PORTABLE CHEST 1 VIEW COMPARISON:  02/11/2020 FINDINGS: The lungs are hyperinflated in keeping with changes of underlying COPD, unchanged from prior examination. The lungs are clear. No pneumothorax or pleural effusion. Cardiac size within normal limits. Right internal jugular chest port has been place with its tip within the superior cavoatrial junction. No acute bone abnormality. IMPRESSION: Interval chest port placement, tip at the superior cavoatrial junction. No pneumothorax. COPD. No radiographic evidence of acute cardiopulmonary disease. Electronically Signed   By: Fidela Salisbury M.D.   On: 2021-05-18 02:37   ECHOCARDIOGRAM COMPLETE  Result Date: 05/18/21    ECHOCARDIOGRAM REPORT   Patient Name:   SYLVANUS TELFORD [facility] Date of Exam: 18-May-2021 Medical Rec #:  [id number]   Height:       71.0 in Accession #:    [id number]  Weight:       171.3 lb Date of Birth:  1940-09-01   BSA:          1.974 m Patient Age:    5 years    BP:           82/60 mmHg Patient Gender: M           HR:           96 bpm. Exam Location:  Inpatient Procedure: 2D Echo, 3D Echo, Cardiac Doppler, Color Doppler and Strain Analysis STAT ECHO Indications:    R07.9* Chest pain, unspecified  History:         Patient has prior history of Echocardiogram examinations, most                 recent 02/12/2020. Abnormal ECG, Stroke, Arrythmias:Tachycardia,                 Signs/Symptoms:Bacteremia and Chest Pain; Risk Factors:Current                 Smoker. Cancer. Chemotherapy. Elevated troponin. ETOH.  Sonographer:    Roseanna Rainbow RDCS Referring Phys: [id number] [facility] RATHORE  Sonographer Comments: Global longitudinal strain was attempted. PA talking to patient during stat echo. IMPRESSIONS  1. Left ventricular ejection fraction, by estimation, is 40 to 45%. The left ventricle has mildly decreased function. The left ventricle demonstrates severe hypokinesis in the basal inferior wall with moderate hypokinesis in the remaining myocardial wall segements. global hypokinesis. There is moderate asymmetric left ventricular hypertrophy of the septal and basal-septal segments. Left ventricular diastolic parameters are consistent with Grade II diastolic dysfunction (pseudonormalization).  2. Right ventricular systolic function is normal. The right ventricular size is normal. There is moderately elevated pulmonary artery systolic pressure.  3. The mitral valve is normal in structure. Moderate to severe mitral valve regurgitation. No evidence of mitral stenosis.  4. The aortic valve is normal in structure. Aortic valve regurgitation is not visualized. Aortic valve sclerosis is present, with no evidence of aortic valve stenosis.  5. The inferior vena cava is dilated in size with >50% respiratory variability, suggesting right atrial pressure of 8 mmHg. Comparison(s): A prior study was performed on 02/12/2020. Compared to prior study on 02/12/2020, the ejection fraction is now moderately depressed: was 55 to 60% now 40-45%. Moderate to severe mitral regurgitation now present. FINDINGS  Left Ventricle: Left ventricular ejection fraction, by estimation, is 40 to 45%. The left ventricle has mildly decreased function. The left ventricle  demonstrates severe hypokinesis in the basal inferior wall with moderate hypokinesis in the remaining myocardial wall segements. The average left ventricular global longitudinal strain is -10.9 %. The global longitudinal strain is abnormal. 3D left ventricular ejection fraction analysis performed but not reported based on interpreter judgement due to suboptimal tracking. The left ventricular internal cavity size was normal in size. There is moderate asymmetric left ventricular hypertrophy of the septal and basal-septal segments. Left ventricular diastolic parameters are consistent with Grade II diastolic dysfunction (pseudonormalization). Right Ventricle: The right ventricular size is normal. No increase in right ventricular wall thickness. Right ventricular systolic function is normal. There is moderately elevated pulmonary artery systolic pressure. The tricuspid regurgitant velocity is 3.11 m/s, and with an assumed right atrial pressure of 15 mmHg, the estimated right ventricular systolic pressure is 41.6 mmHg. Left Atrium: Left atrial size was normal in size. Right Atrium: Right atrial size was normal in size. Pericardium: There is no evidence of pericardial effusion. Presence of epicardial fat layer. Mitral Valve: The mitral valve is normal in structure. There is mild thickening of the mitral valve leaflet(s). Mild mitral annular calcification. Moderate to severe mitral valve regurgitation. No evidence of mitral valve stenosis. Tricuspid Valve: The tricuspid valve is normal in structure. Tricuspid valve regurgitation is not demonstrated. No evidence of tricuspid stenosis. Aortic Valve: The aortic valve is normal in structure. Aortic valve regurgitation is not visualized. Aortic valve sclerosis is present, with no evidence of aortic valve stenosis. Pulmonic Valve: The pulmonic valve was normal in structure. Pulmonic valve regurgitation is not visualized. No evidence of pulmonic stenosis. Aorta: The aortic root is  normal in size and structure. Venous: The inferior vena cava is dilated in size with greater than 50% respiratory variability, suggesting right atrial pressure of 8 mmHg. IAS/Shunts: The interatrial septum appears to be lipomatous. No atrial level shunt detected by color flow Doppler.  LEFT VENTRICLE PLAX 2D LVIDd:         5.30 cm     Diastology LVIDs:         4.30 cm     LV e' medial:    7.94 cm/s LV PW:         1.00 cm     LV E/e' medial:  15.1 LV IVS:        1.50 cm     LV e' lateral:   8.27 cm/s LVOT diam:     2.30 cm     LV E/e' lateral: 14.5 LV SV:         65 LV SV Index:   33          2D Longitudinal Strain LVOT Area:     4.15 cm    2D Strain GLS Avg:     -10.9 %  LV Volumes (MOD) LV vol d, MOD A2C: 91.7 ml 3D Volume EF: LV vol d, MOD A4C: 83.4 ml 3D EF:        49 % LV vol s, MOD A2C: 54.2 ml LV EDV:  163 ml LV vol s, MOD A4C: 42.2 ml LV ESV:       82 ml LV SV MOD A2C:     37.5 ml LV SV:        81 ml LV SV MOD A4C:     83.4 ml LV SV MOD BP:      39.6 ml RIGHT VENTRICLE             IVC RV S prime:     11.20 cm/s  IVC diam: 2.20 cm TAPSE (M-mode): 1.7 cm LEFT ATRIUM             Index        RIGHT ATRIUM           Index LA diam:        4.00 cm 2.03 cm/m   RA Area:     13.80 cm LA Vol (A2C):   87.0 ml 44.07 ml/m  RA Volume:   34.90 ml  17.68 ml/m LA Vol (A4C):   38.0 ml 19.25 ml/m LA Biplane Vol: 57.3 ml 29.02 ml/m  AORTIC VALVE LVOT Vmax:   103.00 cm/s LVOT Vmean:  64.000 cm/s LVOT VTI:    0.157 m  AORTA Ao Root diam: 4.20 cm Ao Asc diam:  2.90 cm MITRAL VALVE                  TRICUSPID VALVE MV Area (PHT): 6.32 cm       TR Peak grad:   38.7 mmHg MV Decel Time: 120 msec       TR Vmax:        311.00 cm/s MR Peak grad:    84.6 mmHg MR Mean grad:    50.0 mmHg    SHUNTS MR Vmax:         460.00 cm/s  Systemic VTI:  0.16 m MR Vmean:        324.0 cm/s   Systemic Diam: 2.30 cm MR PISA:         1.57 cm MR PISA Eff ROA: 13 mm MR PISA Radius:  0.50 cm MV E velocity: 120.00 cm/s MV A velocity: 64.50 cm/s MV  E/A ratio:  1.86 Kardie Tobb DO Electronically signed by Berniece Salines DO Signature Date/Time: 05/07/21/9:24:06 AM    Final     Microbiology Recent Results (from the past 240 hour(s))  Resp Panel by RT-PCR (Flu A&B, Covid) Nasopharyngeal Swab     Status: None   Collection Time: May 07, 2021  1:57 AM   Specimen: Nasopharyngeal Swab; Nasopharyngeal(NP) swabs in vial transport medium  Result Value Ref Range Status   SARS Coronavirus 2 by RT PCR NEGATIVE NEGATIVE Final    Comment: (NOTE) SARS-CoV-2 target nucleic acids are NOT DETECTED.  The SARS-CoV-2 RNA is generally detectable in upper respiratory specimens during the acute phase of infection. The lowest concentration of SARS-CoV-2 viral copies this assay can detect is 138 copies/mL. A negative result does not preclude SARS-Cov-2 infection and should not be used as the sole basis for treatment or other patient management decisions. A negative result may occur with  improper specimen collection/handling, submission of specimen other than nasopharyngeal swab, presence of viral mutation(s) within the areas targeted by this assay, and inadequate number of viral copies(<138 copies/mL). A negative result must be combined with clinical observations, patient history, and epidemiological information. The expected result is Negative.  Fact Sheet for Patients:  EntrepreneurPulse.com.au  Fact Sheet for Healthcare Providers:  IncredibleEmployment.be  This test is no t  yet approved or cleared by the [place] and  has been authorized for detection and/or diagnosis of SARS-CoV-2 by FDA under an Emergency Use Authorization (EUA). This EUA will remain  in effect (meaning this test can be used) for the duration of the COVID-19 declaration under Section 564(b)(1) of the Act, 21 U.S.C.section 360bbb-3(b)(1), unless the authorization is terminated  or revoked sooner.       Influenza A by PCR NEGATIVE NEGATIVE  Final   Influenza B by PCR NEGATIVE NEGATIVE Final    Comment: (NOTE) The Xpert Xpress SARS-CoV-2/FLU/RSV plus assay is intended as an aid in the diagnosis of influenza from Nasopharyngeal swab specimens and should not be used as a sole basis for treatment. Nasal washings and aspirates are unacceptable for Xpert Xpress SARS-CoV-2/FLU/RSV testing.  Fact Sheet for Patients: EntrepreneurPulse.com.au  Fact Sheet for Healthcare Providers: IncredibleEmployment.be  This test is not yet approved or cleared by the [place] FDA and has been authorized for detection and/or diagnosis of SARS-CoV-2 by FDA under an Emergency Use Authorization (EUA). This EUA will remain in effect (meaning this test can be used) for the duration of the COVID-19 declaration under Section 564(b)(1) of the Act, 21 U.S.C. section 360bbb-3(b)(1), unless the authorization is terminated or revoked.  Performed at [facility], [place] [address], [place], [place] [address]   MRSA Next Gen by PCR, Nasal     Status: None   Collection Time: 04/27/2021  9:12 AM   Specimen: Nasal Mucosa; Nasal Swab  Result Value Ref Range Status   MRSA by PCR Next Gen NOT DETECTED NOT DETECTED Final    Comment: (NOTE) The GeneXpert MRSA Assay (FDA approved for NASAL specimens only), is one component of a comprehensive MRSA colonization surveillance program. It is not intended to diagnose MRSA infection nor to guide or monitor treatment for MRSA infections. Test performance is not FDA approved in patients less than 7 years old. Performed at [facility], [place] [address], [place], [place] [address]     Lab Basic Metabolic Panel: Recent Labs  Lab 04/16/21 0905 27-Apr-2021 0210 27-Apr-2021 0438 April 27, 2021 0850 04/27/2021 1506  NA 139 135 133*  --  139  K 4.1 5.0 4.1  --  4.1  CL 107 103 105  --  103  CO2 23 17* 19*  --   --   GLUCOSE 114* 174* 118*  --  362*  BUN  15 29* 28*  --  30*  CREATININE 1.50* 2.01* 1.89*  --  2.00*  CALCIUM 8.8* 9.2 8.0*  --   --   MG 1.9  --  1.4* 1.9  --    Liver Function Tests: Recent Labs  Lab 04/16/21 0905 Apr 27, 2021 0438  AST 25 64*  ALT 22 51*  ALKPHOS 83 85  BILITOT 0.4 0.7  PROT 7.0 5.6*  ALBUMIN 3.6 2.9*   No results for input(s): LIPASE, AMYLASE in the last 168 hours. No results for input(s): AMMONIA in the last 168 hours. CBC: Recent Labs  Lab 04/16/21 0905 04-27-21 0210 April 27, 2021 0438 April 27, 2021 1502 04/27/2021 1506  WBC 3.1* 0.4* 0.6*  --   --   NEUTROABS 1.0* 0.2* 0.4*  --   --   HGB 9.5* 11.5* 8.3* 9.6* 9.2*  HCT 27.6* 34.6* 24.3* 31.0* 27.0*  MCV 93.6 95.6 94.6  --   --   PLT 272 328 149*  --   --    Cardiac Enzymes: No results for input(s): CKTOTAL, CKMB, CKMBINDEX,  TROPONINI in the last 168 hours. Sepsis Labs: Recent Labs  Lab 04/16/21 0905 May 10, 2021 0210 May 10, 2021 0220 May 10, 2021 0438 2021/05/10 0729  WBC 3.1* 0.4*  --  0.6*  --   LATICACIDVEN  --   --  7.4* 3.2* 1.9    Procedures/Operations  As per EMR   Bonna Gains Terrelle Ruffolo 05/10/21, 4:17 PM

## 2021-05-09 NOTE — ED Provider Notes (Signed)
[facility] DEPT Provider Note: Georgena Spurling, MD, FACEP  CSN: [id number] MRN: [id number] ARRIVAL: 2021/05/04 at [place]: RESB/RESB   CHIEF COMPLAINT  Shortness of Breath   HISTORY OF PRESENT ILLNESS  05-04-21 2:13 AM Robert Blackwell is a 80 y.o. male currently undergoing chemotherapy (cisplatin, Gemzar, Emend) for bladder cancer.  He is here with chills and generalized weakness that began this morning about 1 AM.  It is accompanied by shortness of breath as well as a heavy sensation in his right leg.  On arrival he was noted to have a heart rate in the 150s and EKG shows wide-complex tachycardia.  He denies any chest pain.  He was noted to be febrile with a temperature of 100.5 on arrival as well.   Past Medical History:  Diagnosis Date   Anemia    Bladder cancer ([place])    Seizure ([place])    Stroke [facility])     Past Surgical History:  Procedure Laterality Date   IR IMAGING GUIDED PORT INSERTION  03/13/2021   VASCULAR SURGERY     VEIN SURGERY      Family History  Problem Relation Age of Onset   Heart disease Mother    Heart disease Father    Seizures Neg Hx     Social History   Tobacco Use   Smoking status: Every Day    Packs/day: 0.75    Types: Cigarettes   Smokeless tobacco: Never  Substance Use Topics   Alcohol use: Yes    Comment: 3 beers daily   Drug use: Never    Prior to Admission medications   Medication Sig Start Date End Date Taking? Authorizing Provider  aspirin EC 81 MG tablet Take 81 mg by mouth daily. Swallow whole.   Yes [provider]  levETIRAcetam (KEPPRA) 500 MG tablet Take 1 tablet (500 mg total) by mouth 2 (two) times daily. 08/15/20 11/08/21 Yes Suzzanne Cloud, NP  lidocaine-prilocaine (EMLA) cream Apply 1 application topically as needed. Patient taking differently: Apply 1 application topically as needed (for port access). 03/07/21  Yes Wyatt Portela, MD  Multiple Vitamin (MULTIVITAMIN WITH MINERALS) TABS tablet Take 1 tablet by mouth  daily.   Yes [provider]  thiamine 100 MG tablet Take 1 tablet (100 mg total) by mouth daily. 02/14/20  Yes Mariel Aloe, MD  prochlorperazine (COMPAZINE) 10 MG tablet Take 1 tablet (10 mg total) by mouth every 6 (six) hours as needed for nausea or vomiting. Patient not taking: No sig reported 03/07/21   Wyatt Portela, MD    Allergies Patient has no known allergies.   REVIEW OF SYSTEMS  Negative except as noted here or in the History of Present Illness.   PHYSICAL EXAMINATION  Initial Vital Signs Blood pressure 116/63, temperature (!) 100.5 F (38.1 C), temperature source Oral, resp. rate (!) 32.  Examination General: Well-developed, well-nourished male in no acute distress; appearance consistent with age of record HENT: normocephalic; atraumatic Eyes: pupils equal, round and reactive to light; extraocular muscles intact Neck: supple Heart: regular rate and rhythm; tachycardia Lungs: clear to auscultation bilaterally Chest: Port-A-Cath right upper chest Abdomen: soft; nondistended; nontender; bowel sounds present Extremities: No deformity; full range of motion; pulses normal Neurologic: Awake, alert and oriented; motor function intact in all extremities and symmetric; no facial droop Skin: Warm and dry Psychiatric: Flat affect   RESULTS  Summary of this visit's results, reviewed and interpreted by myself:   EKG Interpretation  Date/Time:  05-21-2021 01:56:10 EST Ventricular Rate:  150 PR Interval:  90 QRS Duration: 127 QT Interval:  307 QTC Calculation: 485 R Axis:   251 Text Interpretation: Wide-QRS tachycardia Nonspecific IVCD with LAD Previously narrow-complex NSR Confirmed by Joelee Snoke [phone]) on May 21, 2021 2:07:43 AM     EKG Interpretation  Date/Time:  21-May-2021 02:32:35 EST Ventricular Rate:  123 PR Interval:  162 QRS Duration: 145 QT Interval:  333 QTC Calculation: 477 R Axis:   58 Text  Interpretation: Sinus tachycardia IVCD, consider atypical LBBB Confirmed by Khalaya Mcgurn [phone]) on 05-21-2021 2:44:54 AM     EKG Interpretation  Date/Time:  05/21/21 03:18:20 EST Ventricular Rate:  109 PR Interval:  199 QRS Duration: 83 QT Interval:  317 QTC Calculation: 427 R Axis:   43 Text Interpretation: Sinus tachycardia Nonspecific repol abnormality, diffuse leads Previously wide-complex tachycardia Confirmed by Shanon Rosser [phone]) on 2021-05-21 3:23:13 AM     Laboratory Studies: Results for orders placed or performed during the hospital encounter of 05/21/2021 (from the past 24 hour(s))  Resp Panel by RT-PCR (Flu A&B, Covid) Nasopharyngeal Swab     Status: None   Collection Time: 05/21/21  1:57 AM   Specimen: Nasopharyngeal Swab; Nasopharyngeal(NP) swabs in vial transport medium  Result Value Ref Range   SARS Coronavirus 2 by RT PCR NEGATIVE NEGATIVE   Influenza A by PCR NEGATIVE NEGATIVE   Influenza B by PCR NEGATIVE NEGATIVE  CBC with Differential/Platelet     Status: Abnormal   Collection Time: 05/21/2021  2:10 AM  Result Value Ref Range   WBC 0.4 (LL) 4.0 - 10.5 K/uL   RBC 3.62 (L) 4.22 - 5.81 MIL/uL   Hemoglobin 11.5 (L) 13.0 - 17.0 g/dL   HCT 34.6 (L) 39.0 - 52.0 %   MCV 95.6 80.0 - 100.0 fL   MCH 31.8 26.0 - 34.0 pg   MCHC 33.2 30.0 - 36.0 g/dL   RDW 13.0 11.5 - 15.5 %   Platelets 328 150 - 400 K/uL   nRBC 0.0 0.0 - 0.2 %   Neutrophils Relative % 41 %   Neutro Abs 0.2 (LL) 1.7 - 7.7 K/uL   Lymphocytes Relative 59 %   Lymphs Abs 0.3 (L) 0.7 - 4.0 K/uL   Monocytes Relative 0 %   Monocytes Absolute 0.0 (L) 0.1 - 1.0 K/uL   Eosinophils Relative 0 %   Eosinophils Absolute 0.0 0.0 - 0.5 K/uL   Basophils Relative 0 %   Basophils Absolute 0.0 0.0 - 0.1 K/uL   WBC Morphology MORPHOLOGY UNREMARKABLE    Immature Granulocytes 0 %   Abs Immature Granulocytes 0.00 0.00 - 0.07 K/uL  Basic metabolic panel     Status: Abnormal   Collection Time: 05-21-2021   2:10 AM  Result Value Ref Range   Sodium 135 135 - 145 mmol/L   Potassium 5.0 3.5 - 5.1 mmol/L   Chloride 103 98 - 111 mmol/L   CO2 17 (L) 22 - 32 mmol/L   Glucose, Bld 174 (H) 70 - 99 mg/dL   BUN 29 (H) 8 - 23 mg/dL   Creatinine, Ser 2.01 (H) 0.61 - 1.24 mg/dL   Calcium 9.2 8.9 - 10.3 mg/dL   GFR, Estimated 33 (L) >60 mL/min   Anion gap 15 5 - 15  Troponin I (High Sensitivity)     Status: Abnormal   Collection Time: May 21, 2021  2:10 AM  Result Value Ref Range   Troponin I (  High Sensitivity) 26 (H) <18 ng/L  Lactic acid, plasma     Status: Abnormal   Collection Time: May 04, 2021  2:20 AM  Result Value Ref Range   Lactic Acid, Venous 7.4 (HH) 0.5 - 1.9 mmol/L   Imaging Studies: DG Chest Port 1 View  Result Date: May 04, 2021 CLINICAL DATA:  Dyspnea, chills, fever EXAM: PORTABLE CHEST 1 VIEW COMPARISON:  02/11/2020 FINDINGS: The lungs are hyperinflated in keeping with changes of underlying COPD, unchanged from prior examination. The lungs are clear. No pneumothorax or pleural effusion. Cardiac size within normal limits. Right internal jugular chest port has been place with its tip within the superior cavoatrial junction. No acute bone abnormality. IMPRESSION: Interval chest port placement, tip at the superior cavoatrial junction. No pneumothorax. COPD. No radiographic evidence of acute cardiopulmonary disease. Electronically Signed   By: Fidela Salisbury M.D.   On: 2021-05-04 02:37    ED COURSE and MDM  Nursing notes, initial and subsequent vitals signs, including pulse oximetry, reviewed and interpreted by myself.  Vitals:   05-04-21 0237 05/04/2021 0245 2021/05/04 0303 05/04/2021 0315  BP:  91/66 92/63 103/72  Pulse:  (!) 116 (!) 110 (!) 107  Resp:  (!) 22 20 (!) 21  Temp:      TempSrc:      SpO2:  91% 93% 95%  Weight: 77.7 kg     Height: 5\' 11"  (1.803 m)      Medications  amiodarone (NEXTERONE) 1.8 mg/mL load via infusion 150 mg (150 mg Intravenous Bolus from Bag 05/04/21 0229)     Followed by  amiodarone (NEXTERONE PREMIX) 360-4.14 MG/200ML-% (1.8 mg/mL) IV infusion (60 mg/hr Intravenous New Bag/Given May 04, 2021 0229)    Followed by  amiodarone (NEXTERONE PREMIX) 360-4.14 MG/200ML-% (1.8 mg/mL) IV infusion (has no administration in time range)  ceFEPIme (MAXIPIME) 2 g in sodium chloride 0.9 % 100 mL IVPB (2 g Intravenous New Bag/Given 05-04-21 0331)  vancomycin (VANCOREADY) IVPB 1750 mg/350 mL (has no administration in time range)  acetaminophen (TYLENOL) tablet 650 mg (650 mg Oral Given 2021-05-04 0223)  sodium chloride 0.9 % bolus 1,000 mL (1,000 mLs Intravenous New Bag/Given 05-04-2021 0243)  lactated ringers bolus 1,000 mL (1,000 mLs Intravenous New Bag/Given 05/04/2021 0308)   2:21 AM Amiodarone bolus and infusion ordered for wide-complex tachycardia suspicious for ventricular tachycardia.  The patient does not have a history of left bundle branch block.  The tachycardia is regular so I do not believe this represents atrial fibrillation with rapid ventricular response with aberrancy.  This could represent SVT with aberrancy, but amiodarone should treat this as well.   2:45 AM Rate in 110s to 120s after amiodarone bolus.  QRS complexes are still wide but P waves are now present.  He is also having intermittent PVCs which makes me think this is an atrial rhythm with aberrancy.  Blood pressures dropped into the 80s, will start IV fluid bolus.  Sepsis protocol initiated.  3:10 AM Patient converted to narrow complex tachycardia.  Patient's lactate is 7.4, second liter bolus infusing.  Patient's absolute neutrophil count is 0.2, will start antibiotics for neutropenic fever.  3:21 AM BP now 103/73 with second liter bolus infusing.  Maxipime and vancomycin ordered for neutropenic sepsis.  3:49 AM Dr. Marlowe Sax to admit to hospitalist service.  Systolic blood pressure 009 now.  PROCEDURES  Procedures CRITICAL CARE Performed by: Karen Chafe Jazmene Racz Total critical care time: 45  minutes Critical care time was exclusive of separately billable procedures and treating other patients.  Critical care was necessary to treat or prevent imminent or life-threatening deterioration. Critical care was time spent personally by me on the following activities: development of treatment plan with patient and/or surrogate as well as nursing, discussions with consultants, evaluation of patient's response to treatment, examination of patient, obtaining history from patient or surrogate, ordering and performing treatments and interventions, ordering and review of laboratory studies, ordering and review of radiographic studies, pulse oximetry and re-evaluation of patient's condition.   ED DIAGNOSES     ICD-10-CM   1. Neutropenic sepsis ([place])  A41.9    D70.9     2. Wide-complex tachycardia  R00.0     3. AKI (acute kidney injury) ([place])  N17.9          Keeleigh Terris, MD 2021/05/21 [phone]

## 2021-05-09 NOTE — ED Notes (Signed)
EKG exported per Marlowe Sax, MD order.

## 2021-05-09 NOTE — Progress Notes (Signed)
   Patient seen and examined at bedside, patient admitted after midnight, please see earlier detailed admission note by Shela Leff, MD. 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  Subjective: No concerns this morning. Dyspnea is improved. No chest pain. Cough.  BP (!) 82/60   Pulse (!) 101   Temp 98.1 F (36.7 C)   Resp (!) 24   Ht 5\' 11"  (1.803 m)   Wt 77.7 kg   SpO2 97%   BMI 23.89 kg/m   General exam: Appears calm and comfortable Respiratory system: Clear to auscultation. Respiratory effort normal. Cardiovascular system: S1 & S2 heard, RRR. Gastrointestinal system: Abdomen is distended, soft and nontender. No organomegaly or masses felt. Normal bowel sounds heard. Central nervous system: Alert and oriented. No focal neurological deficits. Musculoskeletal: No edema. No calf tenderness Skin: No cyanosis. No rashes Psychiatry: Judgement and insight appear normal. Mood & affect appropriate.   Brief assessment/Plan:  SIRS Unknown source, but if source identified, would qualify as severe sepsis. Empiric Vanc/Cefepime/Flagyl started on admission. Blood and urine cultures obtained. Chest x-ray without acute cardiopulmonary process. Port-A-Cath site looks non-infected. Flu/COVID negative. Patient with respiratory symptoms, however. Lactic acid of 7.4 > 3.2 > 1.9. Development of hypotension that is responding to IV fluids. Unlikely cardiogenic shock from Transthoracic Echocardiogram results. -RVP -Continue Vancomycin/Cefepime/Flagyl -Follow up blood and urine cultures -Continue IV fluids  NSTEMI Significant troponin leak of 26 > 3467 >7594 in setting of sepsis. EKG non-ischemic and patient without chest pain. Cardiology consulted. Stat Transthoracic Echocardiogram significant for reduced EF of 40-45% with severe LV hypokinesis. Discussed with cardiology and no plan for emergent cath at this  time, so patient will stay at [facility]. -Cardiology recommendations: heparin IV  Neutropenic fever ANC of 200 > 400. Management above. -Hematology/oncology consult  Wide-complex tachycardia Present on admission. Converted to sinus rhythm after amiodarone bolus and continuous IV infusion. Cardiology consulted. -Continue amiodarone -Cardiology recommendations  Acute respiratory failure with hypoxia In setting of NSTEMI. Chest x-ray without source for hypoxia. D-dimer elevated. Documented hypoxia of 88% on room air. -Wean to room air as able  Elevated D-dimer Transthoracic Echocardiogram significant for normal RV, so if there is a PE, not significant. Patient with associated CKD in addition to NSTEMI so will avoid contrast imaging. No LE edema concerning for DVT -V/Q scan  CKD stage IIIa Baseline creatinine of about 1.4-1.5. Creatinine of 2.01 on admission. Likely related to hypovolemia.  -BMP in AM -IV fluids  Metabolic acidosis Likely related to AKI.  History of CVA -Continue aspirin  Seizure disorder -Continue Keppra  Urothelial carcinoma of the bladder Patient is currently on chemotherapy as an outpatient with Dr. Alen Blew.  Elevated AST/ALT Mild and in setting of SIRS.  Acute anemia Normocytic anemia Baseline hemoglobin of 11-12. Hemoglobin of 11.5 on admission down to 8.3. no obvious source of bleeding. Associated hypotension which has improved. -Repeat H&H this afternoon  Pancytopenia Likely related to chemotherapy. Oncology consulted.  Family communication: Wife at bedside DVT prophylaxis: Lovenx Disposition: Stepdown unit  Cordelia Poche, MD [facility] May 22, 2021, 8:04 AM

## 2021-05-09 NOTE — Consult Note (Signed)
NAME:  Robert Blackwell, MRN:  [id number], DOB:  Mar 27, 1941, LOS: 0 ADMISSION DATE:  2021/05/01, CONSULTATION DATE: May 01, 2021 REFERRING MD: ED, CHIEF COMPLAINT: Shortness of breath  History of Present Illness:  80 y.o. man with recently initiated chemotherapy for bladder cancer who presented to the ED with shortness of breath.  Found to be in wide-complex arrhythmia.  Given amiodarone.  Continue amnio drip.  Heart rate improved improved.  Since then his shortness of breath improved.  He 89 chest pain.  He did endorse dysuria over the last week or so.  Urine has been dark.  No cough.  No sputum production.  He was found to be hypotensive.  He improved after about 2 L of IV fluid bolus.  However he he then developed borderline hypotension.  This prompted PCCM consultation.  He was admitted to the stepdown unit.  His blood pressures remained low.  He was started on norepinephrine for blood pressure support.  Had minimal urine output.  Strain the cath about 30 cc out.  Bladder scan revealed over 400 cc.  Unable to pass Foley catheter.  Requesting assistance from urology nurse.  Later in the afternoon, developed wide-complex tachycardia steady 1 20-1 30.  Contacted cardiology.  Amiodarone was rebolused.  EKG obtained.  QTC was okay, no torsades.  Shortness of breath did not not immediately improved.  Shortly thereafter, called back to the room.  PEA arrest.  See CODE BLUE documentation.  Pertinent  Medical History  Bladder cancer, hyperglycemia  Significant Hospital Events: Including procedures, antibiotic start and stop dates in addition to other pertinent events   11/14 admitted to the hospital, septic shock, presumed UTI, subsequent CODE BLUE now on life support  Interim History / Subjective:  As above  Objective   Blood pressure (!) 103/44, pulse 95, temperature (!) 97 F (36.1 C), temperature source Oral, resp. rate (!) 21, height 5\' 11"  (1.803 m), weight 79.7 kg, SpO2 98 %.    FiO2 (%):  [3 %] 3  %   Intake/Output Summary (Last 24 hours) at May 01, 2021 1225 Last data filed at 05-01-21 0920 Gross per 24 hour  Intake 654.58 ml  Output --  Net 654.58 ml   Filed Weights   05-01-21 0237 2021/05/01 0915  Weight: 77.7 kg 79.7 kg    Examination: Prior to [place]: Elderly, in no acute distress HENT: No icterus, EOMI, PERRL Lungs: Clear, normal work of breathing on 2 L nasal cannula Cardiovascular: Regular rate and rhythm, no murmur Abdomen: Nondistended, bowel sounds present Extremities: Warm, no edema, no joint deformity Neuro: No weakness, cranial nerves intact  Initial chest x-ray clear TTE obtained with new reduced EF, global reduction of EF per report, worsened now moderate to severe MR, normal RV size and function, RA pressure estimated 8 Troponins initially 26 trended up to near [facility] Problem list     Assessment & Plan:  Septic shock: Presumed urinary source given dysuria, history of UTI in the past.  UA dirty --Continue broad-spectrum antibiotics, vasopressor support  Wide-complex tachycardia: Appreciate cardiology assistance.  Felt to be atrial arrhythmia with aberrancy or rate dependent bundle branch block. --Cardiology consult, continue amiodarone  Elevated troponin, NSTEMI: Suspect supply mismatch in the setting of septic shock, tachycardia, likely undiagnosed coronary disease, essentially failed stress test. --Heparin per ACS protocol --Cardiology consult, appreciate assistance  Bladder cancer: Recently diagnosed, received day 1 chemotherapy cycle 2 about 1 week ago. --Inform oncology of admission  Hypomagnesemia: Suspect related to  platinum based chemotherapy --Replaced with improved magnesium, 1.9  AKI: Suspected ATN in the setting of hypotension --Creatinine slight improvement after fluids  New reduced EF, worsening mitral valve disease, pulmonary edema: Following code event.  TTE prior to code.  For underlying ischemia.   Worsened from 2021, most recent TTE otherwise. --Appreciate cardiology assistance --Hold on diuresis for now  Acute hypoxemic respiratory: Incentive CODE BLUE event, pulmonary edema. --Diurese when able --PRVC  Best Practice (right click and "Reselect all SmartList Selections" daily)   Diet/type: NPO DVT prophylaxis: systemic heparin GI prophylaxis: N/A Lines: yes and it is still needed Foley:  N/A Code Status:  full code Last date of multidisciplinary goals of care discussion [discussed with wife and patient a.m. 11/14 prior to bringing to the ICU, full code, but would not want prolonged life support]  Labs   CBC: Recent Labs  Lab 04/16/21 0905 2021/05/10 0210 05-10-2021 0438  WBC 3.1* 0.4* 0.6*  NEUTROABS 1.0* 0.2* 0.4*  HGB 9.5* 11.5* 8.3*  HCT 27.6* 34.6* 24.3*  MCV 93.6 95.6 94.6  PLT 272 328 149*    Basic Metabolic Panel: Recent Labs  Lab 04/16/21 0905 10-May-2021 0210 May 10, 2021 0438 May 10, 2021 0850  NA 139 135 133*  --   K 4.1 5.0 4.1  --   CL 107 103 105  --   CO2 23 17* 19*  --   GLUCOSE 114* 174* 118*  --   BUN 15 29* 28*  --   CREATININE 1.50* 2.01* 1.89*  --   CALCIUM 8.8* 9.2 8.0*  --   MG 1.9  --  1.4* 1.9   GFR: Estimated Creatinine Clearance: 33.2 mL/min (A) (by C-G formula based on SCr of 1.89 mg/dL (H)). Recent Labs  Lab 04/16/21 0905 05-10-2021 0210 05/10/21 0220 10-May-2021 0438 05-10-21 0729  WBC 3.1* 0.4*  --  0.6*  --   LATICACIDVEN  --   --  7.4* 3.2* 1.9    Liver Function Tests: Recent Labs  Lab 04/16/21 0905 05-10-21 0438  AST 25 64*  ALT 22 51*  ALKPHOS 83 85  BILITOT 0.4 0.7  PROT 7.0 5.6*  ALBUMIN 3.6 2.9*   No results for input(s): LIPASE, AMYLASE in the last 168 hours. No results for input(s): AMMONIA in the last 168 hours.  ABG    Component Value Date/Time   TCO2 16 (L) 02/10/2020 2044     Coagulation Profile: Recent Labs  Lab 2021/05/10 1047  INR 1.2    Cardiac Enzymes: No results for input(s): CKTOTAL, CKMB,  CKMBINDEX, TROPONINI in the last 168 hours.  HbA1C: No results found for: HGBA1C  CBG: No results for input(s): GLUCAP in the last 168 hours.  Review of Systems:   No chest pain.  No chest pain with exertion.  No orthopnea or PND.  Comprehensive review of systems otherwise negative.  Past Medical History:  He,  has a past medical history of Anemia, Bladder cancer ([place]), Seizure ([place]), and Stroke ([place]).   Surgical History:   Past Surgical History:  Procedure Laterality Date   IR IMAGING GUIDED PORT INSERTION  03/13/2021   VASCULAR SURGERY     VEIN SURGERY       Social History:   reports that he has been smoking cigarettes. He has been smoking an average of .75 packs per day. He has never used smokeless tobacco. He reports current alcohol use. He reports that he does not use drugs.   Family History:  His family history includes Heart disease  in his father and mother. There is no history of Seizures.   Allergies No Known Allergies   Home Medications  Prior to Admission medications   Medication Sig Start Date End Date Taking? Authorizing Provider  aspirin EC 81 MG tablet Take 81 mg by mouth daily. Swallow whole.   Yes [provider]  levETIRAcetam (KEPPRA) 500 MG tablet Take 1 tablet (500 mg total) by mouth 2 (two) times daily. 08/15/20 11/08/21 Yes Suzzanne Cloud, NP  lidocaine-prilocaine (EMLA) cream Apply 1 application topically as needed. Patient taking differently: Apply 1 application topically as needed (for port access). 03/07/21  Yes Wyatt Portela, MD  Multiple Vitamin (MULTIVITAMIN WITH MINERALS) TABS tablet Take 1 tablet by mouth daily.   Yes [provider]  thiamine 100 MG tablet Take 1 tablet (100 mg total) by mouth daily. 02/14/20  Yes Mariel Aloe, MD  prochlorperazine (COMPAZINE) 10 MG tablet Take 1 tablet (10 mg total) by mouth every 6 (six) hours as needed for nausea or vomiting. Patient not taking: No sig reported 03/07/21   Wyatt Portela, MD      Critical care time:     CRITICAL CARE Performed by: Lanier Clam   Total critical care time: 90 minutes  Critical care time was exclusive of separately billable procedures and treating other patients.  Critical care was necessary to treat or prevent imminent or life-threatening deterioration.  Critical care was time spent personally by me on the following activities: development of treatment plan with patient and/or surrogate as well as nursing, discussions with consultants, evaluation of patient's response to treatment, examination of patient, obtaining history from patient or surrogate, ordering and performing treatments and interventions, ordering and review of laboratory studies, ordering and review of radiographic studies, pulse oximetry and re-evaluation of patient's condition.

## 2021-05-09 NOTE — Progress Notes (Signed)
ANTICOAGULATION CONSULT NOTE  Pharmacy Consult for Heparin Indication: chest pain/ACS  No Known Allergies  Patient Measurements: Height: 5\' 11"  (180.3 cm) Weight: 79.7 kg (175 lb 11.3 oz) IBW/kg (Calculated) : 75.3 Heparin Dosing Weight: 79.7 kg  Vital Signs: Temp: 97 F (36.1 C) (11/14 0915) Temp Source: Oral (11/14 0915) BP: 103/44 (11/14 0915) Pulse Rate: 95 (11/14 0937)  Labs: Recent Labs    May 20, 2021 0210 05-20-2021 0438 2021/05/20 0700 05/20/2021 0850  HGB 11.5* 8.3*  --   --   HCT 34.6* 24.3*  --   --   PLT 328 149*  --   --   CREATININE 2.01* 1.89*  --   --   TROPONINIHS 26* 3,467* 7,594* 8,185*    Estimated Creatinine Clearance: 33.2 mL/min (A) (by C-G formula based on SCr of 1.89 mg/dL (H)).   Medical History: Past Medical History:  Diagnosis Date   Anemia    Bladder cancer ([place])    Seizure ([place])    Stroke [facility])    Assessment: 80 y/o M on chemotherapy for urothelial carcinoma of the bladder admitted for FN. Pharmacy consulted to manage IV heparin infusion for possible ACS.   Goal of Therapy:  Heparin level 0.3-0.7 units/ml Monitor platelets by anticoagulation protocol: Yes   Plan:  No bolus per cardiology Start heparin infusion at 950 units/hr Check anti-Xa level in 8 hours and daily while on heparin Continue to monitor H&H and platelets  Ulice Dash D 05-20-2021,10:01 AM

## 2021-05-09 NOTE — ED Notes (Signed)
Patient placed on zoll monitor

## 2021-05-09 NOTE — ED Notes (Addendum)
Unable to get a hold of Marlowe Sax, MD or Teryl Lucy, MD despite multiple attempted. Dr. Hal Hope paged for assistance with patient's condition. ICU RN made aware that patient has become unstable. ICU RN and this RN in agreement for patient to stay in department until next course of treatment is decided.

## 2021-05-09 NOTE — Code Documentation (Signed)
Was called to bedside.  Heart rate steady, regular at 128-130.  Wide-complex.  EKG obtained.  Given regularly wide-complex, suspicious for a flutter with controlled rate variable bundle block versus aberrancy.  QTc 500.  Cardiology contacted.  Agreed with amiodarone 150 mg bolus, continue IV infusion.  Within a few minutes, heart rate slowed to 90s to 100.  Only symptom patient endorsed during this was shortness of breath.  No chest pain.  Shortness of breath did  not improve immediately with improvement in heart rate.  Shortly thereafter, called back to bedside as patient was unresponsive.  Pulseless.  PEA on monitor.  Chest compressions started.  Code lasted about 12-13 minutes.  4 doses of epinephrine.  PEA arrest on every pulse check.  Patient was intubated during code thanks our anesthesia colleagues.  ROSC was obtained.  Narrow complex, bradycardia on monitor.  Blood pressure hypertensive.  Concerning that patient is bradycardic after multiple pushes of epinephrine.  Discussed in detail with wife at bedside.  She explained that after talk this morning where they both agreed to full code, patient stated he would not want to live on machines.  He would not want prolonged life support.  Stated we would certainly honor these wishes.  We hope we can make things better short-term but if not we will be realistic and give our best counsel moving forward.  Plan: Stat chest x-ray Repeat labs, troponin, chemistries, CBC, lactic acid Reengage cardiology given now bradycardic after PEA arrest Amiodarone stopped now that patient is bradycardic Repeat EKG now stat Continue IV heparin for ACS Continue broad-spectrum antibiotics Continue vasopressor support, prefer phenylephrine given wide-complex tachycardia, map goal greater than 65  See full consult note for further details

## 2021-05-09 NOTE — Progress Notes (Signed)
   05-15-21 1600  Clinical Encounter Type  Visited With Patient and family together  Visit Type Initial;Spiritual support;Psychological support;Social support;Code;Death  Referral From Physician  Consult/Referral To Chaplain  Spiritual Encounters  Spiritual Needs Sacred text;Prayer;Ritual;Emotional;Grief support  Stress Factors  Patient Stress Factors None identified  Family Stress Factors Loss   Chaplain was called to CODE and was with family at time of death - accompanied MD and RN to room with family and assisted in helping family members grieve and say good bye to their loved one.  Provided spiritual care in the form of prayer and anointing of oil and comfort counseling.   Will remain availabel for family as needed.

## 2021-05-09 NOTE — Progress Notes (Signed)
A consult was received from an ED physician for Vancomycin per pharmacy dosing.  The patient's profile has been reviewed for ht/wt/allergies/indication/available labs.   A one time order has been placed for Vancomycin 1750mg  IV.  Further antibiotics/pharmacy consults should be ordered by admitting physician if indicated.                       Thank you, Netta Cedars PharmD 05-07-21  3:20 AM

## 2021-05-09 DEATH — deceased

## 2021-05-14 ENCOUNTER — Ambulatory Visit: Payer: Medicare Other

## 2021-05-14 ENCOUNTER — Other Ambulatory Visit: Payer: Medicare Other

## 2021-05-28 ENCOUNTER — Other Ambulatory Visit: Payer: Medicare Other

## 2021-05-28 ENCOUNTER — Ambulatory Visit: Payer: Medicare Other

## 2021-05-28 ENCOUNTER — Ambulatory Visit: Payer: Medicare Other | Admitting: Oncology

## 2021-06-04 ENCOUNTER — Ambulatory Visit: Payer: Medicare Other

## 2021-06-04 ENCOUNTER — Other Ambulatory Visit: Payer: Medicare Other

## 2021-06-20 ENCOUNTER — Ambulatory Visit: Payer: Medicare Other | Admitting: Neurology

## 2022-03-19 IMAGING — MR MR HEAD W/O CM
4 of 13 series · 9 of 48 positions shown · non-contrast
Comparison: Head CT from yesterday

CLINICAL DATA: Seizure with abnormal neuro exam

EXAM:
MRI HEAD WITHOUT CONTRAST
TECHNIQUE: Multiplanar, multiecho pulse sequences of the brain and surrounding
structures were obtained without intravenous contrast.

[Series 6: FLAIR · sagittal · 5.0mm · 0.23mm/px · 2 of 24 slices shown (1 of 4)]
[im 1/24]
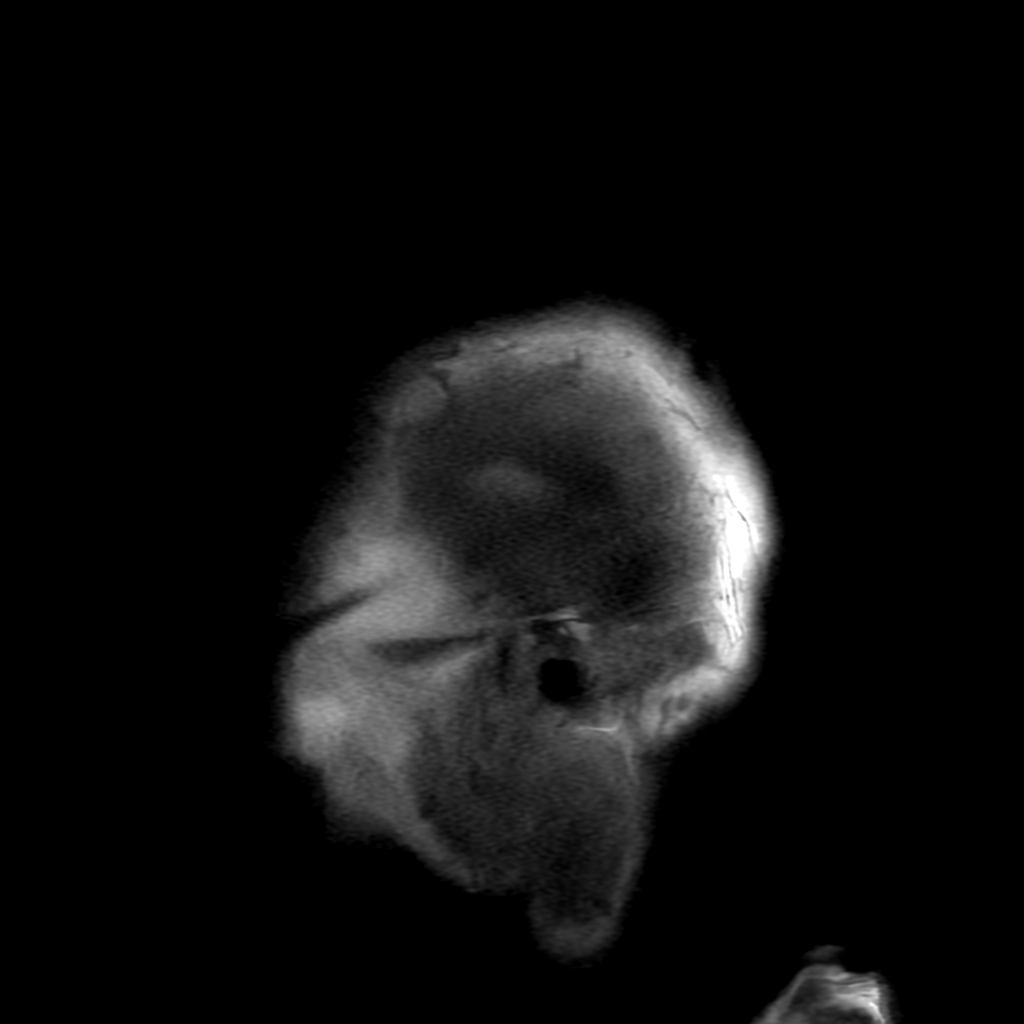
[im 24/24]
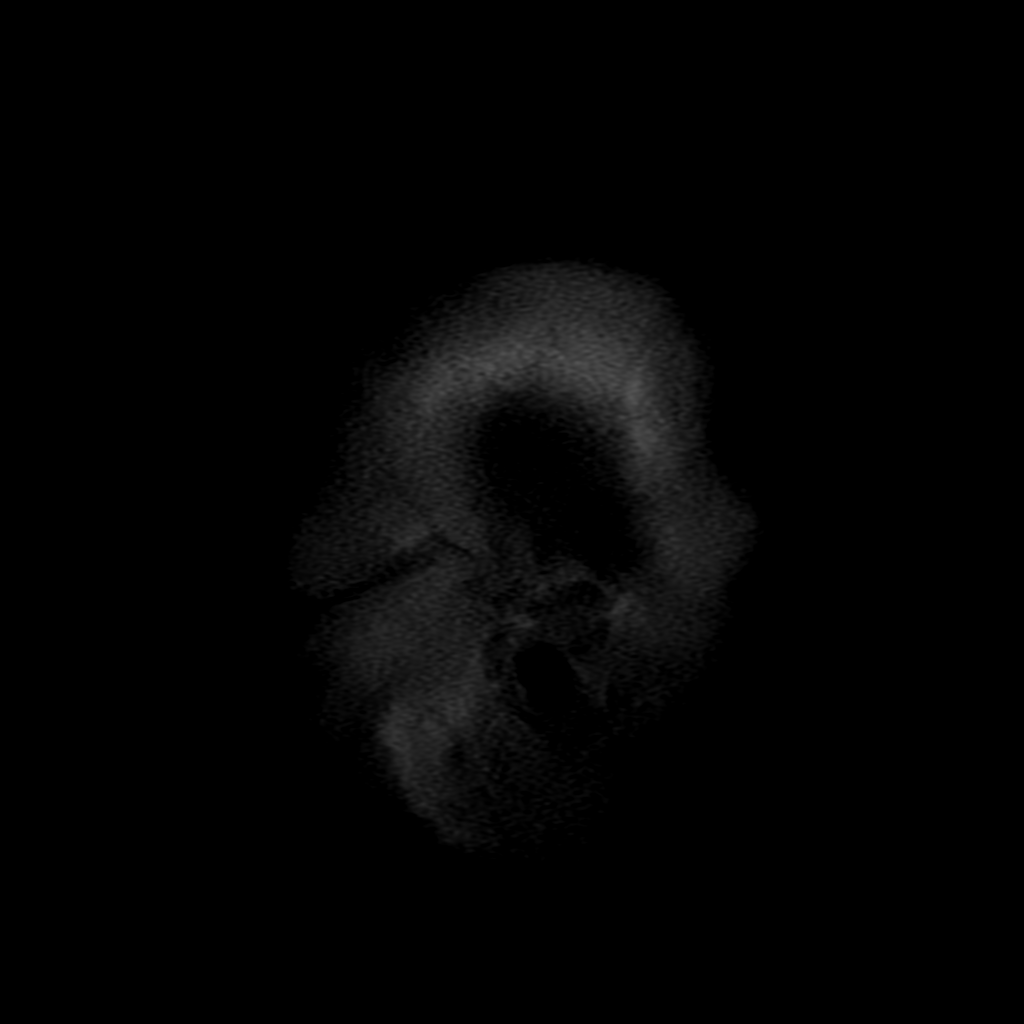

[Series 7: FLAIR · axial · 3.0mm · 0.41mm/px · z∈[-62,+82]mm · 2 of 25 slices shown (2 of 4)]
[im 1/25]
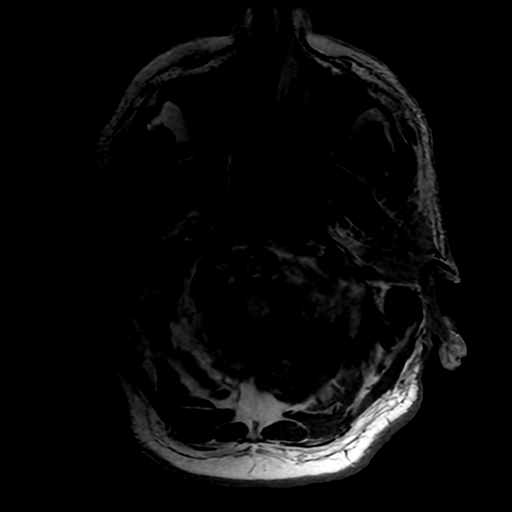
[im 25/25]
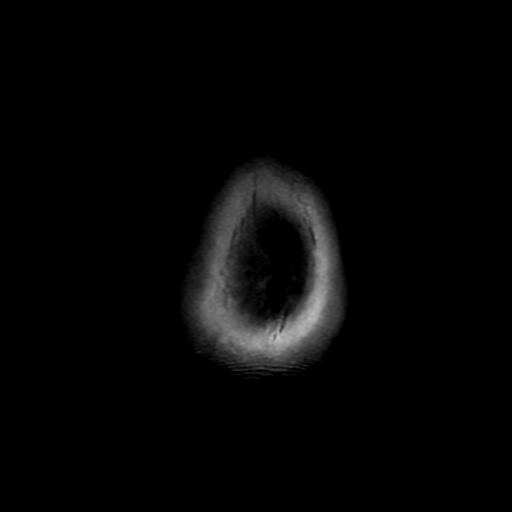

[Series 11: FLAIR · coronal · 3.0mm · 0.35mm/px · 3 of 41 slices shown (3 of 4)]
[im 1/41]
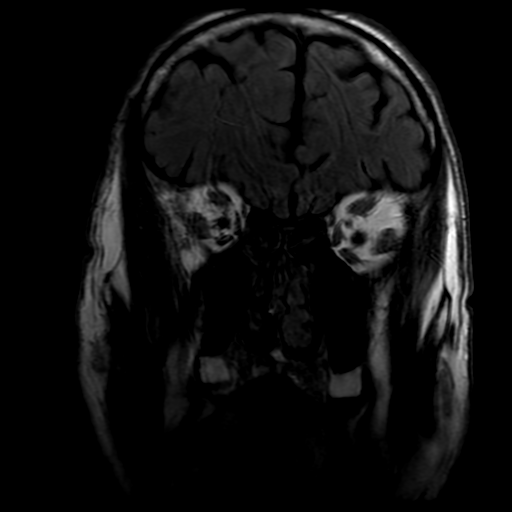
[im 21/41]
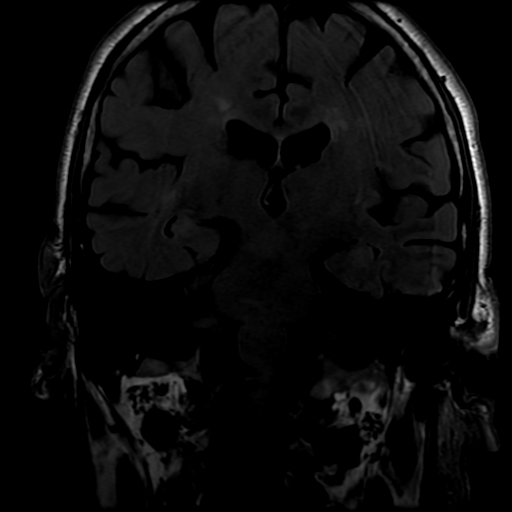
[im 41/41]
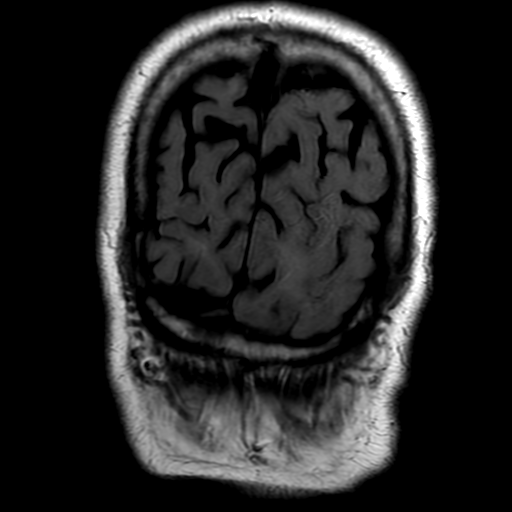

[Series 13: FLAIR · sagittal · 5.0mm · 0.23mm/px · 2 of 24 slices shown (4 of 4)]
[im 1/24]
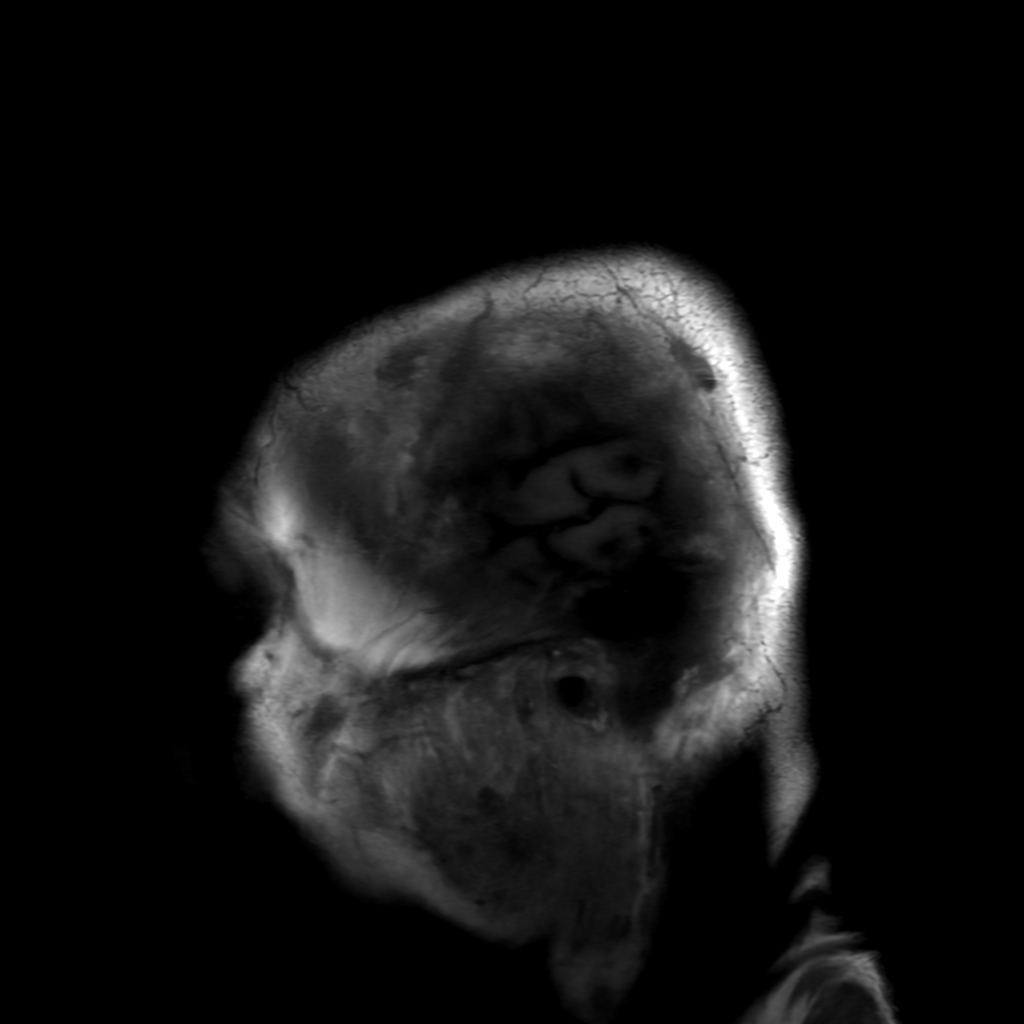
[im 24/24]
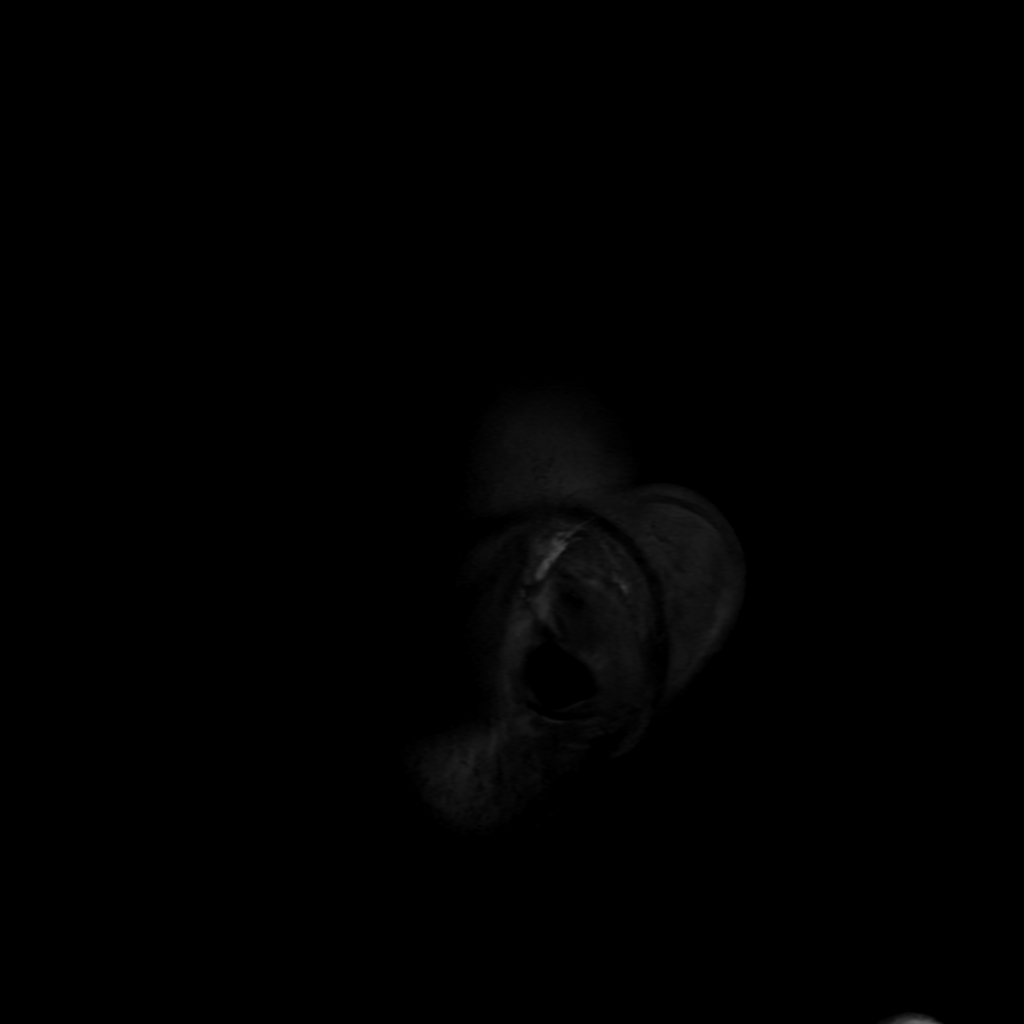

[9 of 48 positions shown; findings below may reference images not displayed]

FINDINGS: Brain: No acute infarction, hemorrhage, hydrocephalus, extra-axial
collection or mass lesion. Cerebral volume loss which is mild for
age. Chronic small vessel ischemia that is confluent in the
biparietal white matter. No focal cortical finding to explain
seizure. Unremarkable appearance of the bilateral hippocampus. Small
remote bilateral cerebellar infarcts.

Vascular: Preserved flow voids. Small basilar likely reflecting
bilateral fetal type PCA.

Skull and upper cervical spine: No evidence of marrow lesion.

Sinuses/Orbits: Mild patchy mucosal thickening in the paranasal
sinuses.
IMPRESSION: 1. No acute or reversible finding. No focal cortical finding to
explain seizure.
2. Moderate chronic small vessel ischemia. Small remote bilateral
cerebellar infarcts.
